# Patient Record
Sex: Female | Born: 1955 | Race: White | Hispanic: No | Marital: Married | Smoking: Never smoker
Health system: Southern US, Community
[De-identification: ages and names within clinical notes are randomized; demographics above are authoritative.]

## PROBLEM LIST (undated history)

## (undated) DIAGNOSIS — T7840XA Allergy, unspecified, initial encounter: Secondary | ICD-10-CM

## (undated) DIAGNOSIS — K529 Noninfective gastroenteritis and colitis, unspecified: Secondary | ICD-10-CM

## (undated) DIAGNOSIS — R32 Unspecified urinary incontinence: Secondary | ICD-10-CM

## (undated) DIAGNOSIS — M722 Plantar fascial fibromatosis: Secondary | ICD-10-CM

## (undated) DIAGNOSIS — G8929 Other chronic pain: Secondary | ICD-10-CM

## (undated) DIAGNOSIS — J45909 Unspecified asthma, uncomplicated: Secondary | ICD-10-CM

## (undated) DIAGNOSIS — M1990 Unspecified osteoarthritis, unspecified site: Secondary | ICD-10-CM

## (undated) HISTORY — DX: Unspecified osteoarthritis, unspecified site: M19.90

## (undated) HISTORY — PX: TONSILLECTOMY: SUR1361

## (undated) HISTORY — DX: Plantar fascial fibromatosis: M72.2

## (undated) HISTORY — DX: Allergy, unspecified, initial encounter: T78.40XA

## (undated) HISTORY — PX: DILATION AND CURETTAGE OF UTERUS: SHX78

## (undated) HISTORY — DX: Unspecified urinary incontinence: R32

## (undated) HISTORY — DX: Other chronic pain: G89.29

## (undated) HISTORY — DX: Noninfective gastroenteritis and colitis, unspecified: K52.9

---

## 2018-12-06 LAB — HM PAP SMEAR: HM Pap smear: ABNORMAL

## 2019-06-30 ENCOUNTER — Ambulatory Visit: Payer: Self-pay

## 2019-06-30 ENCOUNTER — Ambulatory Visit: Payer: Managed Care, Other (non HMO) | Attending: Internal Medicine

## 2019-06-30 DIAGNOSIS — Z20822 Contact with and (suspected) exposure to covid-19: Secondary | ICD-10-CM | POA: Insufficient documentation

## 2019-07-01 LAB — SARS-COV-2, NAA 2 DAY TAT

## 2019-07-01 LAB — NOVEL CORONAVIRUS, NAA: SARS-CoV-2, NAA: NOT DETECTED

## 2019-08-31 ENCOUNTER — Other Ambulatory Visit: Payer: Self-pay

## 2019-08-31 ENCOUNTER — Other Ambulatory Visit: Payer: Managed Care, Other (non HMO)

## 2019-08-31 DIAGNOSIS — Z20822 Contact with and (suspected) exposure to covid-19: Secondary | ICD-10-CM

## 2019-09-01 LAB — NOVEL CORONAVIRUS, NAA: SARS-CoV-2, NAA: NOT DETECTED

## 2019-09-01 LAB — SARS-COV-2, NAA 2 DAY TAT

## 2020-02-07 HISTORY — PX: COLONOSCOPY: SHX174

## 2020-04-27 ENCOUNTER — Other Ambulatory Visit: Payer: Self-pay

## 2020-04-27 ENCOUNTER — Ambulatory Visit
Admission: EM | Admit: 2020-04-27 | Discharge: 2020-04-27 | Disposition: A | Discharge status: Home / Self Care | Payer: Managed Care, Other (non HMO) | Attending: Internal Medicine | Admitting: Internal Medicine

## 2020-04-27 ENCOUNTER — Encounter: Payer: Self-pay | Admitting: Emergency Medicine

## 2020-04-27 DIAGNOSIS — J45901 Unspecified asthma with (acute) exacerbation: Secondary | ICD-10-CM | POA: Diagnosis not present

## 2020-04-27 DIAGNOSIS — T7840XA Allergy, unspecified, initial encounter: Secondary | ICD-10-CM | POA: Diagnosis not present

## 2020-04-27 HISTORY — DX: Unspecified asthma, uncomplicated: J45.909

## 2020-04-27 MED ORDER — ALBUTEROL SULFATE HFA 108 (90 BASE) MCG/ACT IN AERS
2.0000 | INHALATION_SPRAY | RESPIRATORY_TRACT | 0 refills | Status: DC | PRN
Start: 2020-04-27 — End: 2022-02-06

## 2020-04-27 NOTE — ED Provider Notes (Signed)
[facility]    CSN: [id number] Arrival date & time: 04/27/20  1115      History   Chief Complaint No chief complaint on file.   HPI Wendy Gallagher is a 65 y.o. female who  Developed SOB and wheezing after drinking tea and coffy 1 h ago. She does not know if there was some ingredient that she is allergic to, but has been using her inhaler and is better, but she is almost out and needs a refill. Denies fever, chills, sweats, throat swelling, tongue swelling.  She also took 1 dose of Loratadine.      Past Medical History:  Diagnosis Date  . Asthma     There are no problems to display for this patient.   History reviewed. No pertinent surgical history.  OB History   No obstetric history on file.      Home Medications    Prior to Admission medications   Medication Sig Start Date End Date Taking? Authorizing Provider  albuterol (VENTOLIN HFA) 108 (90 Base) MCG/ACT inhaler Inhale 2 puffs into the lungs every 4 (four) hours as needed for wheezing or shortness of breath. 04/27/20  Yes Rodriguez-Southworth, Nettie Elm, PA-C    Family History Family History  Problem Relation Age of Onset  . Atrial fibrillation Mother   . Cancer Father   . Heart attack Father     Social History Social History   Tobacco Use  . Smoking status: Never Smoker  . Smokeless tobacco: Never Used  Substance Use Topics  . Alcohol use: Yes    Alcohol/week: 1.0 standard drink    Types: 1 Glasses of wine per week  . Drug use: Never     Allergies   Patient has no known allergies.   Review of Systems Review of Systems  Gastrointestinal:       Gets intermittent diarrhea     Physical Exam Triage Vital Signs ED Triage Vitals  Enc Vitals Group     BP 04/27/20 1128 131/87     Pulse Rate 04/27/20 1128 89     Resp 04/27/20 1128 18     Temp 04/27/20 1128 97.6 F (36.4 C)     Temp Source 04/27/20 1128 Oral     SpO2 04/27/20 1128 95 %     Weight --      Height --      Head  Circumference --      Peak Flow --      Pain Score 04/27/20 1129 0     Pain Loc --      Pain Edu? --      Excl. in GC? --   Physical Exam Vitals signs and nursing note reviewed.  Constitutional:      General: She is not in acute distress.    Appearance: Normal appearance. She is not ill-appearing, toxic-appearing or diaphoretic.  HENT:     Head: Normocephalic.     Right Ear: Tympanic membrane, ear canal and external ear normal.     Left Ear: Tympanic membrane, ear canal and external ear normal.     Nose: Nose normal.     Mouth/Throat:     Mouth: Mucous membranes are moist.  Eyes:     General: No scleral icterus.       Right eye: No discharge.        Left eye: No discharge.     Conjunctiva/sclera: Conjunctivae normal.  Neck:     Musculoskeletal: Neck supple. No neck rigidity.  Cardiovascular:  Rate and Rhythm: Normal rate and regular rhythm.     Heart sounds: No murmur.  Pulmonary:     Effort: Pulmonary effort is normal.     Breath sounds: Normal breath sounds.  Abdominal:     General: Bowel sounds are normal. There is no distension.     Palpations: Abdomen is soft. There is no mass.     Tenderness: There is no abdominal tenderness. There is no guarding or rebound.     Hernia: No hernia is present.  Musculoskeletal: Normal range of motion.  Lymphadenopathy:     Cervical: No cervical adenopathy.  Skin:    General: Skin is warm and dry.     Coloration: Skin is not jaundiced.     Findings: No rash.  Neurological:     Mental Status: She is alert and oriented to person, place, and time.     Gait: Gait normal.  Psychiatric:        Mood and Affect: Mood normal.        Behavior: Behavior normal.        Thought Content: Thought content normal.        Judgment: Judgment normal.    No data found.  Updated Vital Signs BP 131/87 (BP Location: Right Arm)   Pulse 89   Temp 97.6 F (36.4 C) (Oral)   Resp 18   SpO2 95%  Pulse ox was repeated 98% after removing her  mask Visual Acuity Right Eye Distance:   Left Eye Distance:   Bilateral Distance:    Right Eye Near:   Left Eye Near:    Bilateral Near:     Physical Exam Physical Exam Vitals signs and nursing note reviewed.  Constitutional:      General: She is not in acute distress.    Appearance: Normal appearance. She is not ill-appearing, toxic-appearing or diaphoretic.  HENT:     Head: Normocephalic.     Right Ear: Tympanic membrane, ear canal and external ear normal.     Left Ear: Tympanic membrane, ear canal and external ear normal.     Nose: Nose normal.     Mouth/Throat:     Mouth: Mucous membranes are moist.  Eyes:     General: No scleral icterus.       Right eye: No discharge.        Left eye: No discharge.     Conjunctiva/sclera: Conjunctivae normal.  Neck:     Musculoskeletal: Neck supple. No neck rigidity.  Cardiovascular:     Rate and Rhythm: Normal rate and regular rhythm.     Heart sounds: No murmur.  Pulmonary:     Effort: Pulmonary effort is normal.     Breath sounds: Normal breath sounds.  Abdominal:     General: Bowel sounds are normal. There is no distension.     Palpations: Abdomen is soft. There is no mass.     Tenderness: There is no abdominal tenderness. There is no guarding or rebound.     Hernia: No hernia is present.  Musculoskeletal: Normal range of motion.  Lymphadenopathy:     Cervical: No cervical adenopathy.  Skin:    General: Skin is warm and dry.     Coloration: Skin is not jaundiced.     Findings: No rash.  Neurological:     Mental Status: She is alert and oriented to person, place, and time.     Gait: Gait normal.  Psychiatric:        Mood and  Affect: Mood normal.        Behavior: Behavior normal.        Thought Content: Thought content normal.        Judgment: Judgment normal.     UC Treatments / Results  Labs (all labs ordered are listed, but only abnormal results are displayed) Labs Reviewed - No data to  display  EKG   Radiology No results found.  Procedures Procedures (including critical care time)  Medications Ordered in UC Medications - No data to display  Initial Impression / Assessment and Plan / UC Course  I have reviewed the triage vital signs and the nursing notes. Asthma exacerbation and possibly allergic reaction. She is stable and I refilled her inhaler and advised to stay on Loratadine.    Final Clinical Impressions(s) / UC Diagnoses   Final diagnoses:  Asthma with acute exacerbation, unspecified asthma severity, unspecified whether persistent  Allergic reaction, initial encounter     Discharge Instructions     Stay on the Loratadine for a few days, one dose per day      ED Prescriptions    Medication Sig Dispense Auth. Provider   albuterol (VENTOLIN HFA) 108 (90 Base) MCG/ACT inhaler Inhale 2 puffs into the lungs every 4 (four) hours as needed for wheezing or shortness of breath. 18 g Rodriguez-Southworth, Nettie Elm, PA-C     PDMP not reviewed this encounter.   Garey Ham, PA-C 05/01/20 0710

## 2020-04-27 NOTE — ED Triage Notes (Signed)
States she put tea in her coffee this morning and states she started having trouble breathing after that this morning.

## 2020-04-27 NOTE — Discharge Instructions (Addendum)
Stay on the Loratadine for a few days, one dose per day

## 2020-06-18 ENCOUNTER — Ambulatory Visit: Payer: Managed Care, Other (non HMO) | Admitting: Family Medicine

## 2020-08-02 ENCOUNTER — Ambulatory Visit (INDEPENDENT_AMBULATORY_CARE_PROVIDER_SITE_OTHER): Payer: Managed Care, Other (non HMO) | Admitting: Medical

## 2020-08-02 ENCOUNTER — Other Ambulatory Visit: Payer: Self-pay

## 2020-08-02 ENCOUNTER — Encounter: Payer: Self-pay | Admitting: Medical

## 2020-08-02 VITALS — BP 124/80 | HR 100 | Temp 98.0°F | Wt 219.8 lb

## 2020-08-02 DIAGNOSIS — J4540 Moderate persistent asthma, uncomplicated: Secondary | ICD-10-CM

## 2020-08-02 DIAGNOSIS — L570 Actinic keratosis: Secondary | ICD-10-CM

## 2020-08-02 DIAGNOSIS — Z136 Encounter for screening for cardiovascular disorders: Secondary | ICD-10-CM | POA: Diagnosis not present

## 2020-08-02 DIAGNOSIS — R200 Anesthesia of skin: Secondary | ICD-10-CM

## 2020-08-02 DIAGNOSIS — M722 Plantar fascial fibromatosis: Secondary | ICD-10-CM

## 2020-08-02 DIAGNOSIS — H539 Unspecified visual disturbance: Secondary | ICD-10-CM

## 2020-08-02 DIAGNOSIS — K529 Noninfective gastroenteritis and colitis, unspecified: Secondary | ICD-10-CM

## 2020-08-02 DIAGNOSIS — R202 Paresthesia of skin: Secondary | ICD-10-CM

## 2020-08-02 MED ORDER — FLUTICASONE-SALMETEROL 100-50 MCG/ACT IN AEPB
1.0000 | INHALATION_SPRAY | Freq: Two times a day (BID) | RESPIRATORY_TRACT | 3 refills | Status: DC
Start: 2020-08-02 — End: 2022-02-06

## 2020-08-02 MED ORDER — HYDROQUINONE 4 % EX CREA: TOPICAL_CREAM | Freq: Two times a day (BID) | CUTANEOUS | 0 refills | Status: DC

## 2020-08-02 MED ORDER — ALBUTEROL SULFATE HFA 108 (90 BASE) MCG/ACT IN AERS
2.0000 | INHALATION_SPRAY | Freq: Four times a day (QID) | RESPIRATORY_TRACT | 1 refills | Status: DC | PRN
Start: 2020-08-02 — End: 2022-02-10

## 2020-08-02 NOTE — Progress Notes (Signed)
Subjective:  Wendy Gallagher is a 65 y.o. female who presents for Chief Complaint  Patient presents with   other    New pt. Est. Broke toe lt. Foot baby toe. Had an episode about 2 month ago felt like had a mini stroke. Pt. Has memory issues and ibs flares. Pt. Has had diarrhea off and on for about 1 year now has colonoscopy scheduled on Tuesday Dr. Jason Fila in [place].      Here as a new patient.  Hasn't had insurance for a while which has limited her access to care.  Medical team: [facility] - recent care for broke toe and plantar fascitis Not currently seeing eye doctor Needs to go back to dentist No other specialist   Concerns:  She has several concerns  She notes years ago had a sudden vision change where it was like a curtain going down, losing vision in upper half of visual field.  Had evaluation, and while in the ED symptoms resolved.  She notes 8 weeks had a change in vision similar but it was like curtain falling from right vision and lower field of vision seemed off.   Has had occasional numbness in limbs intermittent.  No palpations, no chest pain.     No recent leg swelling.  No palpations.  Has had occasional right hand numbness, but this is chronic that she attributes to neck issues.  No slurred speech, no hearing loss, no recent sudden change with numbness tingling or weakness.   Started taking OTC hawthorne, and has continued this since years ago which seems to help.  She notes being seen a few years ago for similar vision change and palpations, saw doctor then but was advised she had panic attack.  Asthma -  Has hx/o asthma, and breathing has been worse with the 90+ temps recently.  She has history of being on Advair in the remote past that did help.  She does not smoke  Does have hx/o urinary incontinence but no new changes.  She notes frequent chronic diarrhea since 2019.  Started after [place] burger [place] the airport and symptoms persist.  On average is 6x   daily, loose stools.   Seeing ortho for recent broken toe and plantar fasciitis  Concerned about age spots, right face  No other aggravating or relieving factors.    No other c/o.  Past Medical History:  Diagnosis Date   Allergy    Arthritis    Asthma    Chronic diarrhea    Chronic pain    Plantar fasciitis    Urinary incontinence    Current Outpatient Medications on File Prior to Visit  Medication Sig Dispense Refill   albuterol (VENTOLIN HFA) 108 (90 Base) MCG/ACT inhaler Inhale 2 puffs into the lungs every 4 (four) hours as needed for wheezing or shortness of breath. 18 g 0   No current facility-administered medications on file prior to visit.   Past Surgical History:  Procedure Laterality Date   COLONOSCOPY  02/2020   [facility]   TONSILLECTOMY       The following portions of the patient's history were reviewed and updated as appropriate: allergies, current medications, past family history, past medical history, past social history, past surgical history and problem list.  ROS Otherwise as in subjective above  Objective: BP 124/80   Pulse 100   Temp 98 F (36.7 C)   Wt 219 lb 12.8 oz (99.7 kg)   General appearance: alert, no distress, well developed,  well nourished, white female 3 views of the left somewhat trapezoidal lesions, each @1cm  x 0.34mm size of the right temple regio, 2 of them are yellow which one is more brown, all 3 uniform color suggestive for keratosis HEENT: normocephalic, sclerae anicteric, conjunctiva pink and moist Oral cavity: MMM, no lesions Neck: supple, no lymphadenopathy, no thyromegaly, no masses , no bruits Heart: RRR, normal S1, S2, no murmurs Lungs: CTA bilaterally, no wheezes, rhonchi, or rales Abdomen: +bs, soft, non tender, non distended, no masses, no hepatomegaly, no splenomegaly Pulses: 2+ radial pulses, 2+ pedal pulses, normal cap refill Ext: no edema, moderate LE varicose veins No visual field deficit.  EOMI,  PERRLA Neuro: Alert and oriented x3, nonfocal exam    EKG Indication numbness and screening heart disease Rate 88 bpm, normal sinus rhythm, PR 144 ms, QRS 110 ms, 474 ms QTc, axis 16 degrees, incomplete right bundle branch block, no prior EKG to compare.    Assessment: Encounter Diagnoses  Name Primary?   Vision changes Yes   Numbness and tingling    Moderate persistent asthma without complication    Solar keratosis    Chronic diarrhea    Plantar fasciitis    Screening for heart disease      Plan: Vision change, numbness  -I recommend you see an eye doctor for baseline evaluation.  Given the concern for prior vision loss years ago and given the recent unusual symptoms you have had, I will schedule you for a carotid ultrasound screening  Asthma-begin Advair preventative inhaler twice daily.  Rinse mouth out with water after use.  Continue emergency rescue inhaler albuterol as needed every 4-6 hours for wheezing, cough, shortness of breath.  You may find that you do not need the Advair during parts of the year.  So this is a preventative/maintenance inhaler during times of the year you have worse flareups  Chronic diarrhea-I recommend you follow back up with digestive health to get their recommendations if your colonoscopy was normal.  We will have you sign for a copy of the colonoscopy to be sent to [facility].  If there was no abnormal finding in if the diagnosis is IBS there are some treatments you could consider trying such as Viberzi daily medication or hyoscyamine or other antispasm medication.  I would avoid foods that make things worse particularly spicy foods acidic foods fried foods and junk food.  She has used Bentyl in the past.  Follow-up with gastroenterology as planned  Foot pain, plantar fasciitis-follow-up with orthopedics  Skin lesion, solar keratosis - begin hydroquinone cream to ageing spots.  Recheck 39mo    Gayathri was seen today for other.  Diagnoses and all orders  for this visit:  Vision changes -     3mo Carotid Duplex Bilateral; Future  Numbness and tingling -     US Carotid Duplex Bilateral; Future -     EKG 12-Lead  Moderate persistent asthma without complication  Solar keratosis  Chronic diarrhea  Plantar fasciitis  Screening for heart disease -     EKG 12-Lead  Other orders -     fluticasone-salmeterol (ADVAIR) 100-50 MCG/ACT AEPB; Inhale 1 puff into the lungs 2 (two) times daily. -     albuterol (VENTOLIN HFA) 108 (90 Base) MCG/ACT inhaler; Inhale 2 puffs into the lungs every 6 (six) hours as needed for wheezing or shortness of breath. -     hydroquinone 4 % cream; Apply topically 2 (two) times daily.   Follow up: pending studies

## 2020-08-07 LAB — HM COLONOSCOPY

## 2020-08-09 ENCOUNTER — Ambulatory Visit: Payer: Managed Care, Other (non HMO) | Admitting: Surgery

## 2020-08-10 ENCOUNTER — Ambulatory Visit
Admission: RE | Admit: 2020-08-10 | Discharge: 2020-08-10 | Disposition: A | Discharge status: Home / Self Care | Payer: Managed Care, Other (non HMO) | Source: Ambulatory Visit | Attending: Medical | Admitting: Medical

## 2020-08-10 DIAGNOSIS — R200 Anesthesia of skin: Secondary | ICD-10-CM

## 2020-08-10 DIAGNOSIS — R202 Paresthesia of skin: Secondary | ICD-10-CM

## 2020-08-10 DIAGNOSIS — H539 Unspecified visual disturbance: Secondary | ICD-10-CM

## 2020-08-15 ENCOUNTER — Ambulatory Visit: Payer: Managed Care, Other (non HMO) | Admitting: Surgery

## 2020-08-17 ENCOUNTER — Encounter: Payer: Self-pay | Admitting: Internal Medicine

## 2020-09-05 ENCOUNTER — Ambulatory Visit: Payer: Managed Care, Other (non HMO) | Admitting: Medical

## 2020-09-20 ENCOUNTER — Encounter: Payer: Self-pay | Admitting: Internal Medicine

## 2022-02-06 ENCOUNTER — Ambulatory Visit: Payer: 59 | Admitting: Medical

## 2022-02-06 VITALS — BP 120/70 | HR 89 | Wt 212.4 lb

## 2022-02-06 DIAGNOSIS — J4520 Mild intermittent asthma, uncomplicated: Secondary | ICD-10-CM | POA: Insufficient documentation

## 2022-02-06 DIAGNOSIS — Z7185 Encounter for immunization safety counseling: Secondary | ICD-10-CM | POA: Diagnosis not present

## 2022-02-06 DIAGNOSIS — M8580 Other specified disorders of bone density and structure, unspecified site: Secondary | ICD-10-CM | POA: Insufficient documentation

## 2022-02-06 DIAGNOSIS — Z8781 Personal history of (healed) traumatic fracture: Secondary | ICD-10-CM

## 2022-02-06 DIAGNOSIS — R053 Chronic cough: Secondary | ICD-10-CM | POA: Insufficient documentation

## 2022-02-06 MED ORDER — ALBUTEROL SULFATE HFA 108 (90 BASE) MCG/ACT IN AERS: 2.0000 | INHALATION_SPRAY | Freq: Four times a day (QID) | RESPIRATORY_TRACT | 1 refills | Status: DC | PRN

## 2022-02-06 NOTE — Patient Instructions (Addendum)
Mild asthma You can use albuterol inhaler 2 puffs every 4-6 hours as needed for shortness of breath, cough, chest tightness or wheezing I wrote a prescription for a spacer to use attached to the inhaler to get better effectiveness of the inhaler device Make sure you are taking 2 long deep inhalations when you use the medication Consider taking an allergy medicine such as Zyrtec or Allegra during periods where you have worse asthma flare such as spring or fall If you are having worse problems with asthma such as symptoms several days per week then consider a preventative inhaler which is not the same as albuterol.  Return to discuss this if you start to get more worse regular symptoms We will check a spirometry or PFT test periodically to check the status of your asthma.  We can do this today or at future visits if desired to get a baseline idea of your asthma If you do not see improvements in the next week or 2 with your cough we may need to consider a chest x-ray Given the fact that you had a hard time with coughing and illness this past year and never a few weeks ago, if you would like to go ahead and do a chest x-ray I can place an order for that  Vaccine recommendations There are several vaccines that are recommended given your age and given history of asthma.  I have included this information below Yearly influenza vaccine Pneumococcal pneumonia vaccine called Prevnar 20 x 1 Shingrix shingles vaccine 2 shots 2 months apart Tetanus vaccine Tdap that has the pertussis protection, 1 vaccine every 10 years RSV vaccine at your local pharmacy   Consider coming in soon for routine physical.  You have not had blood work since 2022.  A physical is a visit for we can discuss preventative measures, routine lab screening, heart disease screening, osteoporosis screening and other recommendations.

## 2022-02-06 NOTE — Progress Notes (Signed)
Subjective:  Wendy Gallagher is a 67 y.o. female who presents for Chief Complaint  Patient presents with   needs refill on inhaler    Needs refill on inhaler, 3 week ago- cough and still lingering       Here for asthma follow up.   Last visit here over a year ago.  Has been out of her inhaler but having some cough.  Been having chest tightness, cough, some productive cough for 3 weeks or so.  Felt exhausted the first week, but those symptoms improved, and now has annoying cough.  Has some runny nose, some congestion at times.   Some ear pressure yesterday, occasional sore throat.  Some rattling in chest but no wheezing.   Was around heavy smoke from father smoking as a child.  Has 3 dogs.  No ongoing monthly problems with asthma, but had bad illness in summer 2023 that aggravated her asthma.  Didn't tolerate Advair in the past.  Had covid infection 2019, and since then has had to use inhaler more in general than prior to 2019.   Nonsmoker.  No recent GERD issues aggravating cough.  Has colonoscopy in 2022.    Has arthritis.  At times lots of joint pains.  Using magnesium and boron supplement for arthritis and feels like this helps her arthritis.   She notes that she saw her dentist not so long ago and they recommended she be on osteoporosis treatment given some bone loss in her jaw.  They felt she needed to be on Fosamax or something similar.  No other aggravating or relieving factors.    No other c/o.  Past Medical History:  Diagnosis Date   Allergy    Arthritis    Asthma    Chronic diarrhea    Chronic pain    Plantar fasciitis    Urinary incontinence    Current Outpatient Medications on File Prior to Visit  Medication Sig Dispense Refill   albuterol (VENTOLIN HFA) 108 (90 Base) MCG/ACT inhaler Inhale 2 puffs into the lungs every 6 (six) hours as needed for wheezing or shortness of breath. 8 g 1   B Complex Vitamins (VITAMIN B COMPLEX PO) Take 1 tablet by mouth daily.     BORON  PO Take by mouth.     CALCIUM MAGNESIUM ZINC PO Take by mouth.     No current facility-administered medications on file prior to visit.    The following portions of the patient's history were reviewed and updated as appropriate: allergies, current medications, past family history, past medical history, past social history, past surgical history and problem list.  ROS Otherwise as in subjective above  Objective: BP 120/70   Pulse 89   Wt 212 lb 6.4 oz (96.3 kg)   General appearance: alert, no distress, well developed, well nourished HEENT: normocephalic, sclerae anicteric, conjunctiva pink and moist, TMs pearly, nares patent, no discharge or erythema, pharynx normal Oral cavity: MMM, no lesions Neck: supple, no lymphadenopathy, no thyromegaly, no masses Heart: RRR, normal S1, S2, no murmurs Lungs: CTA bilaterally, no wheezes, rhonchi, or rales Pulses: 2+ radial pulses, 2+ pedal pulses, normal cap refill Ext: no edema   Assessment: Encounter Diagnoses  Name Primary?   Mild intermittent asthma, unspecified whether complicated Yes   Chronic cough    Vaccine counseling    History of fracture    Bone loss      Plan: Exam unremarkable today. Discussed her asthma symptoms, triggers, treatment recommendations.  Discussed the following recommendations  which were printed for patient as well.  Advise she consider bone density test.  We discussed calcium and vitamin D supplements.  Patient Instructions  Mild asthma You can use albuterol inhaler 2 puffs every 4-6 hours as needed for shortness of breath, cough, chest tightness or wheezing I wrote a prescription for a spacer to use attached to the inhaler to get better effectiveness of the inhaler device Make sure you are taking 2 long deep inhalations when you use the medication Consider taking an allergy medicine such as Zyrtec or Allegra during periods where you have worse asthma flare such as spring or fall If you are having worse  problems with asthma such as symptoms several days per week then consider a preventative inhaler which is not the same as albuterol.  Return to discuss this if you start to get more worse regular symptoms We will check a spirometry or PFT test periodically to check the status of your asthma.  We can do this today or at future visits if desired to get a baseline idea of your asthma If you do not see improvements in the next week or 2 with your cough we may need to consider a chest x-ray Given the fact that you had a hard time with coughing and illness this past year and never a few weeks ago, if you would like to go ahead and do a chest x-ray I can place an order for that  Vaccine recommendations There are several vaccines that are recommended given your age and given history of asthma.  I have included this information below Yearly influenza vaccine Pneumococcal pneumonia vaccine called Prevnar 20 x 1 Shingrix shingles vaccine 2 shots 2 months apart Tetanus vaccine Tdap that has the pertussis protection, 1 vaccine every 10 years RSV vaccine at your local pharmacy   Consider coming in soon for routine physical.  You have not had blood work since 2022.  A physical is a visit for we can discuss preventative measures, routine lab screening, heart disease screening, osteoporosis screening and other recommendations.    Wendy Gallagher was seen today for needs refill on inhaler.  Diagnoses and all orders for this visit:  Mild intermittent asthma, unspecified whether complicated  Chronic cough  Vaccine counseling  History of fracture  Bone loss  Other orders -     albuterol (VENTOLIN HFA) 108 (90 Base) MCG/ACT inhaler; Inhale 2 puffs into the lungs every 6 (six) hours as needed for wheezing or shortness of breath.    Follow up: soon for physical

## 2022-02-10 ENCOUNTER — Telehealth: Payer: Self-pay | Admitting: Family Medicine

## 2022-02-10 ENCOUNTER — Ambulatory Visit: Payer: 59 | Admitting: Family Medicine

## 2022-02-10 ENCOUNTER — Encounter: Payer: Self-pay | Admitting: Family Medicine

## 2022-02-10 ENCOUNTER — Telehealth: Payer: Self-pay | Admitting: Medical

## 2022-02-10 VITALS — BP 108/60 | HR 80 | Temp 97.1°F | Ht 65.75 in | Wt 215.8 lb

## 2022-02-10 DIAGNOSIS — L02512 Cutaneous abscess of left hand: Secondary | ICD-10-CM

## 2022-02-10 MED ORDER — DOXYCYCLINE HYCLATE 100 MG PO TABS: 100.0000 mg | ORAL_TABLET | Freq: Two times a day (BID) | ORAL | 0 refills | Status: DC

## 2022-02-10 NOTE — Telephone Encounter (Signed)
Spoke with patient and she was more concerned about the light sensitivity so she decided to take the doxy and wear sunscreen.

## 2022-02-10 NOTE — Progress Notes (Signed)
Chief Complaint  Patient presents with   Wound Infection    Left thumb infection, she used a scalpel to open up her left thumb and she said a splinter came out and some white stuff. It is nagging and annoying. Saturday she felt sick, lethargic.    2-3 weeks ago she was helping her daughter move into her new house in [place].  She got a splinter from the banister, in her left thumb. She thought she removed the splinter. On the return trip, she could feel like there was still something in the thumb (discomfort with pressure, when turning knobs). On Friday evening (02/07/22) she unwrapped the bandage, it looked brighter red; yesterday it seemed worse, with some white area under the skin.  It was too tender to press on it. She lanced it yesterday afternoon.  It drained some pus, and another piece of splinter came out as well. It felt great an hour later. It is very sensitive to touch again today. She denies further drainage.  She doesn't feel well, possibly feverish.  She has taken some tylenol.   PMH, PSH, SH reviewed  Seen last week with asthma/cough.  Doing better now.  Outpatient Encounter Medications as of 02/10/2022  Medication Sig Note   acetaminophen (TYLENOL) 325 MG tablet Take 325 mg by mouth every 6 (six) hours as needed. 02/10/2022: Last dose one hour ago   Apoaequorin (PREVAGEN PO) Take 1 capsule by mouth daily.    Ascorbic Acid (VITAMIN C) 500 MG CAPS Take 1 capsule by mouth daily.    B Complex Vitamins (VITAMIN B COMPLEX PO) Take 1 tablet by mouth daily.    ECHINACEA EXTRACT PO Take 1 capsule by mouth daily.    HAWTHORN PO Take 1 capsule by mouth daily.    Magnesium 500 MG CAPS Take 1 capsule by mouth daily.    albuterol (VENTOLIN HFA) 108 (90 Base) MCG/ACT inhaler Inhale 2 puffs into the lungs every 6 (six) hours as needed for wheezing or shortness of breath. (Patient not taking: Reported on 02/10/2022) 02/10/2022: Used last week   [DISCONTINUED] albuterol (VENTOLIN HFA) 108 (90 Base)  MCG/ACT inhaler Inhale 2 puffs into the lungs every 6 (six) hours as needed for wheezing or shortness of breath.    [DISCONTINUED] BORON PO Take by mouth.    [DISCONTINUED] CALCIUM MAGNESIUM ZINC PO Take by mouth.    No facility-administered encounter medications on file as of 02/10/2022.   No Known Allergies   ROS: she feels "crummy", possibly feverish. No chills. No n/v/d. She had a recent cold/cough. Seen recently (last week) for her asthma.  Cough is improving. Hasn't been needing much albuterol. No n/v/d.  Has IBS, and is anxious about antibiotics causing diarrhea. She denies bleeding, bruising, or other concerns.   PHYSICAL EXAM:  BP 108/60   Pulse 80   Temp (!) 97.1 F (36.2 C) (Tympanic)   Ht 5' 5.75" (1.67 m)   Wt 215 lb 12.8 oz (97.9 kg)   BMI 35.10 kg/m   Wt Readings from Last 3 Encounters:  02/10/22 215 lb 12.8 oz (97.9 kg)  02/06/22 212 lb 6.4 oz (96.3 kg)  08/02/20 219 lb 12.8 oz (99.7 kg)   Talkative, pleasant female, in no distress HEENT: conjunctiva and sclera are clear, EOMI Neck: no lymphadenopathy or mass Heart: regular rate and rhythm Lungs: clear, no wheezing Extremities: upper--2+ pulses, brisk capillary refill in L thumb. L thumb-- 2 x 2 cm area of erythema and soft tissue swelling, with central  white area that measures 4.5 mm.  This central area is firm/rough.  The red area is soft, not indurated, not fluctuant.  +tender. This is located on the ulnar side of the palmar aspect of the thumb   ASSESSMENT/PLAN:  Abscess of left thumb - infection with foreign body/splinter.  some FB removed yesterday.  Continue soaks, treat cellulitis with ABX. f/u if worsening for I&D, not needed today - Plan: doxycycline (VIBRA-TABS) 100 MG tablet  Discussed Bactrim--recalls possible allergy, and is hesitant to use this. She would prefer MRSA coverage (also discussed rx for keflex), so will use doxycyline.  Counseled re: risks/SE, to use sunscreen. Discussed  probiotic use to help with bowels.   Take the antibiotic twice daily. Use sunscreen to protect from burns while on the medication. If you develop any diarrhea, take a probiotic to help re-set your gut flora. If you have a problem with the antibiotic, let us know and we can change it.  Please soak the thumb in either warm soapy water or epsom salts. 15 minutes 3-4 times per day. This should soften the firm central area, and possible allow further drainage, if there is any pus still inside.  There did not appear to be any significant abscess that required drainage.  Follow up next week for a recheck if not improving. Concerns would be fever, increasing size of the redness, streaking going up the arm, swelling and pain, crusting, vomiting, chills, etc.

## 2022-02-10 NOTE — Telephone Encounter (Signed)
Pt called and is requesting that her doxycycline be changed she said it was discussed it could mess her stomach up, and she dont think someone with IBS could handle it,  Pt uses  [facility] [phone] - [address] AT [address]

## 2022-02-10 NOTE — Patient Instructions (Signed)
  Take the antibiotic twice daily. Use sunscreen to protect from burns while on the medication. If you develop any diarrhea, take a probiotic to help re-set your gut flora. If you have a problem with the antibiotic, let us know and we can change it.  Please soak the thumb in either warm soapy water or epsom salts. 15 minutes 3-4 times per day. This should soften the firm central area, and possible allow further drainage, if there is any pus still inside.  There did not appear to be any significant abscess that required drainage.  Follow up next week for a recheck if not improving. Concerns would be fever, increasing size of the redness, streaking going up the arm, swelling and pain, crusting, vomiting, chills, etc.

## 2022-02-10 NOTE — Telephone Encounter (Signed)
We discussed that ANY antibiotic could bother your stomach, and to use a probiotic while taking it. She thought she might be allergic to sulfa drugs (but this was NOT listed as an allergy in her chart). Does she want me to switch to that (and verify her allergy?), or the other option we discussed was prescribing Keflex (which couldn't cover MRSA--which I think is fine, but she mentioned wanting MRSA coverage).

## 2022-02-17 NOTE — Telephone Encounter (Signed)
Error

## 2022-04-18 ENCOUNTER — Other Ambulatory Visit: Payer: Self-pay

## 2022-04-18 ENCOUNTER — Ambulatory Visit: Admission: EM | Admit: 2022-04-18 | Discharge: 2022-04-18 | Discharge status: Against Medical Advice | Payer: Self-pay

## 2022-04-18 ENCOUNTER — Encounter: Payer: Self-pay | Admitting: Emergency Medicine

## 2022-04-18 LAB — POCT URINALYSIS DIP (MANUAL ENTRY)
Bilirubin, UA: NEGATIVE
Blood, UA: NEGATIVE
Glucose, UA: NEGATIVE mg/dL
Nitrite, UA: NEGATIVE
Protein Ur, POC: NEGATIVE mg/dL
Spec Grav, UA: >=1.03 — AB (ref 1.010–1.025)
Urobilinogen, UA: 0.2 E.U./dL
pH, UA: 5 (ref 5.0–8.0)

## 2022-04-18 NOTE — ED Triage Notes (Signed)
Pt reports left groin tenderness and "burning" sensation since waking up this morning. Denies any known injury but reports is under a lot of stress lately. Pt able to bear weight.

## 2022-04-18 NOTE — ED Notes (Signed)
Pt left prior to provider assessment. Pt reported "was feeling better and needed fresh air" and walked out of UC. NP aware.

## 2022-04-18 NOTE — ED Notes (Signed)
Pt unable to provide urine sample at this time. Ice water provided. Pt aware urine sample needed.

## 2022-07-02 ENCOUNTER — Telehealth: Payer: Self-pay

## 2022-07-02 NOTE — Telephone Encounter (Signed)
Patient called the office at 3:30pm stating that earlier today she had an episode of sudden anxiousness, and a feeling that her heart was pounding in her chest. She says she felt her pulse and it was skipping beats every 6 seconds. This episode lasted 2 hours. She decided to take 2 tylenol, Vitamin C and 1/2 tablet of Aspirin 325mg . She had her husband pray for her and she started to feel better. This was about an hour ago. Patient is currently having heaviness in her chest and reflux like symptoms. I consulted with Dr. Susann Givens regarding patient's symptoms. Per Dr. Susann Givens, patient needs to go to the ER for evaluation. I advised patient of Dr. Jola Babinski recommendation. She was reluctant at first, due to not having anyone to watch her pets. Patient finally agreed to go to [facility] since this was the closest hospital near her current location.

## 2022-10-28 ENCOUNTER — Telehealth: Payer: 59 | Admitting: Medical

## 2022-10-29 ENCOUNTER — Other Ambulatory Visit: Payer: Self-pay

## 2022-10-29 ENCOUNTER — Telehealth: Payer: Self-pay | Admitting: Medical

## 2023-01-21 ENCOUNTER — Encounter: Payer: BC Managed Care – PPO | Admitting: Medical

## 2023-03-19 ENCOUNTER — Ambulatory Visit: Payer: Self-pay | Admitting: Medical

## 2023-04-24 ENCOUNTER — Telehealth: Payer: Self-pay | Admitting: Physician Assistant

## 2023-04-24 ENCOUNTER — Other Ambulatory Visit: Payer: Self-pay | Admitting: Medical

## 2023-04-24 ENCOUNTER — Ambulatory Visit: Payer: Self-pay

## 2023-04-24 DIAGNOSIS — J45901 Unspecified asthma with (acute) exacerbation: Secondary | ICD-10-CM

## 2023-04-24 MED ORDER — ALBUTEROL SULFATE HFA 108 (90 BASE) MCG/ACT IN AERS
2.0000 | INHALATION_SPRAY | Freq: Four times a day (QID) | RESPIRATORY_TRACT | 0 refills | Status: AC | PRN
Start: 2023-04-24 — End: ?

## 2023-04-24 NOTE — Telephone Encounter (Signed)
  Chief Complaint: watery eyes, sneezing,cough Symptoms: wheezing, mod SOB- can talk in full sentences Frequency: yes Pertinent Negatives: Patient denies fever Disposition: [] ED /[] Urgent Care (no appt availability in office) / [] Appointment(In office/virtual)/ [x]  [facility]/ [] Home Care/ [] Refused Recommended Disposition /[] [facility]/ []  Follow-up with PCP Additional Notes: assisted pt with MyChart Copied from CRM [id number]. Topic: Clinical - Red Word Triage >> Apr 24, 2023  2:39 PM Baldomero Bone wrote: Red Word that prompted transfer to Nurse Triage: albuterol  (VENTOLIN  HFA) 108 (90 Base) MCG/ACT inhaler [ Reason for Disposition  [1] MILD difficulty breathing (e.g., minimal/no SOB at rest, SOB with walking, pulse <100) AND [2] NEW-onset or WORSE than normal    Plus wheezing  Answer Assessment - Initial Assessment Questions 1. RESPIRATORY STATUS: "Describe your breathing?" (e.g., wheezing, shortness of breath, unable to speak, severe coughing)      SOB, wheezing 2. ONSET: "When did this breathing problem begin?"      Yesterday  3. PATTERN "Does the difficult breathing come and go, or has it been constant since it started?"      Worsens when close to dog 4. SEVERITY: "How bad is your breathing?" (e.g., mild, moderate, severe)    - MILD: No SOB at rest, mild SOB with walking, speaks normally in sentences, can lie down, no retractions, pulse < 100.    - MODERATE: SOB at rest, SOB with minimal exertion and prefers to sit, cannot lie down flat, speaks in phrases, mild retractions, audible wheezing, pulse 100-120.    - SEVERE: Very SOB at rest, speaks in single words, struggling to breathe, sitting hunched forward, retractions, pulse > 120      Moderate  5. RECURRENT SYMPTOM: "Have you had difficulty breathing before?" If Yes, ask: "When was the last time?" and "What happened that time?"      yes  7. LUNG HISTORY: "Do you have any history of lung disease?"  (e.g., pulmonary  embolus, asthma, emphysema)     asthma 8. CAUSE: "What do you think is causing the breathing problem?"      Allergies  9. OTHER SYMPTOMS: "Do you have any other symptoms? (e.g., dizziness, runny nose, cough, chest pain, fever)     Runny nose watery nose and tightness  Protocols used: Breathing Difficulty-A-AH

## 2023-04-24 NOTE — Patient Instructions (Signed)
 Shizue Pentecost, thank you for joining Angelia Kelp, PA-C for today's virtual visit.  While this provider is not your primary care provider (PCP), if your PCP is located in our provider database this encounter information will be shared with them immediately following your visit.   A [facility] MyChart account gives you access to today's visit and all your visits, tests, and labs performed at [facility] " click here if you don't have a [facility] MyChart account or go to [email or web address]  Consent: (Patient) Wendy Gallagher provided verbal consent for this virtual visit at the beginning of the encounter.  Current Medications:  Current Outpatient Medications:    albuterol  (VENTOLIN  HFA) 108 (90 Base) MCG/ACT inhaler, Inhale 2 puffs into the lungs every 6 (six) hours as needed for wheezing or shortness of breath., Disp: 18 g, Rfl: 0   B Complex Vitamins (VITAMIN B COMPLEX PO), Take 1 tablet by mouth daily., Disp: , Rfl:    ECHINACEA EXTRACT PO, Take 1 capsule by mouth daily., Disp: , Rfl:    HAWTHORN PO, Take 1 capsule by mouth daily., Disp: , Rfl:    Magnesium 500 MG CAPS, Take 1 capsule by mouth daily., Disp: , Rfl:    Medications ordered in this encounter:  Meds ordered this encounter  Medications   albuterol  (VENTOLIN  HFA) 108 (90 Base) MCG/ACT inhaler    Sig: Inhale 2 puffs into the lungs every 6 (six) hours as needed for wheezing or shortness of breath.    Dispense:  18 g    Refill:  0    Supervising Provider:   Corine Dice [[id number]]     *If you need refills on other medications prior to your next appointment, please contact your pharmacy*  Follow-Up: Call back or seek an in-person evaluation if the symptoms worsen or if the condition fails to improve as anticipated.  [facility] [phone]  Other Instructions Allergies, Adult An allergy is a condition that causes the body's defense system (immune system) to react too strongly to  an allergen. An allergen is a substance that is harmless to most people but can cause a reaction in some people. Allergies often affect the nose (allergic rhinitis), eyes (conjunctivitis), skin (atopic dermatitis), and stomach. They can be mild, moderate, or severe. They cannot spread from person to person. Allergies can start at any age. In some cases, they may go away as you get older. What are the causes? Allergies are caused by allergens. These may be: Outdoor allergens. These include pollen, car fumes, and mold. Indoor allergens. These include dust, smoke, mold, and pet dander. Other allergens. These include foods, medicines, scents, and insect bites or stings. What increases the risk? You are more likely to have allergies if you have: Family members with allergies. Family members who have a condition that may be caused by allergens, such as asthma. What are the signs or symptoms? Symptoms depend on how severe your allergy is. Mild to moderate symptoms Runny nose, stuffy nose (nasal congestion), or sneezing. Itchy mouth, ears, or throat. Postnasal drip. This is a feeling of mucus dripping down the back of your throat. Sore throat. Itchy, red, watery, or puffy eyes. Skin rash, or itchy, red, swollen areas of skin (hives). Stomach cramps or bloating. Severe symptoms A bad allergy to food, medicine, or insect bites may cause a severe reaction (anaphylactic reaction). Symptoms include: A red face. Coughing or making high-pitched whistling sounds when you breathe, most often when you breathe out (  wheezing). Swollen lips, tongue, or mouth. A tight or swollen throat. Chest pain or tightness, or a fast heartbeat. Trouble breathing or shortness of breath. Pain in your abdomen. Vomiting or diarrhea. Feeling dizzy or fainting. How is this diagnosed? Allergies are diagnosed based on your symptoms, your family and medical history, and a physical exam. You may also have tests, such as: Skin  tests. These may be done to see how your skin reacts to allergens. Tests include: Skin prick test. For this test, the allergen is put in your body through a small prick in the skin. Intradermal skin test. For this test, a small amount of the allergen is put under the first layer of your skin. Patch test. For this test, a small amount of the allergen is placed on your skin. The area is covered and then checked after a few days. Blood tests. A challenge test. For this test, you eat or breathe in the allergen to see if you have a reaction. You may also be asked to: Keep a food diary. This means writing down all the foods, drinks, and symptoms you have in a day. Try an elimination diet. To do this: Stop eating certain foods. Add those foods back one by one to find out if any of them cause a reaction. How is this treated?     Treatment for allergies depends on your symptoms. It may include: Cold, wet cloths (cold compresses). These can be used to soothe itching and swelling. Eye drops or nasal sprays. A saline solution to clear out your nose and keep it moist (nasal irrigation). A saline solution is made of salt and water. A humidifier. This can add moisture to the air. Skin creams. These can treat rashes or itching. Diet changes to cut out foods that cause allergies. Being exposed again and again to tiny amounts of allergens. This can help your body build a defense against them (tolerance). This process is called immunotherapy. It may be done using: Allergy shots. This is when you get a shot of the allergen. Sublingual immunotherapy. This is when you take a small dose of the allergen under your tongue. Allergy medicines (antihistamines) or other medicines. These can help block the allergic reaction. Using an auto-injector pen. An auto-injector pen is a device filled with medicine that gives an emergency shot of epinephrine. Your health care provider will teach you how to use it. Follow these  instructions at home: Medicines  Take or apply over-the-counter and prescription medicines only as told by your provider. Always carry your auto-injector pen if you are at risk of an anaphylactic reaction. Give yourself the shot as told by your provider. Eating and drinking Follow instructions from your provider about what you may eat and drink. Drink enough fluid to keep your pee (urine) pale yellow. General instructions Wear a medical alert bracelet or necklace if you have had an anaphylactic reaction in the past. Avoid known allergens when you can. Keep all follow-up visits. Your provider will watch your symptoms and talk about treatment options with you. Contact a health care provider if: Your symptoms do not get better with treatment. Get help right away if: You have any symptoms of anaphylactic reaction. You use an auto-injector pen. You will need more medical care even if the medicine seems to be working. An anaphylactic reaction may happen again within 72 hours (rebound anaphylaxis). These symptoms may be an emergency. Use the auto-injector pen right away. Then call 911. Do not wait to see if  the symptoms will go away. Do not drive yourself to the hospital. This information is not intended to replace advice given to you by your health care provider. Make sure you discuss any questions you have with your health care provider. Document Revised: 09/04/2021 Document Reviewed: 09/04/2021 Elsevier Patient Education  2024 Elsevier Inc.   If you have been instructed to have an in-person evaluation today at a local Urgent Care facility, please use the link below. It will take you to a list of all of our available [facility], including address, phone number and hours of operation. Please do not delay care.  [facility]  If you or a family member do not have a primary care provider, use the link below to schedule a visit and establish care. When you choose a [facility] primary care physician or advanced practice provider, you gain a long-term partner in health. Find a Primary Care Provider  Learn more about [facility]'s in-office and virtual care options: [facility] - Get Care Now

## 2023-04-24 NOTE — Telephone Encounter (Signed)
 Copied from CRM [id number]. Topic: Clinical - Medication Refill >> Apr 24, 2023  2:37 PM Bascom RAMAN wrote: Most Recent Primary Care Visit:  Provider: RANDOL, EVE  Department: [facility]  Visit Type: ACUTE 30  Date: 02/10/2022  Medication: albuterol  (VENTOLIN  HFA) 108 (90 Base) MCG/ACT inhaler [  Has the patient contacted their pharmacy? No (Agent: If no, request that the patient contact the pharmacy for the refill. If patient does not wish to contact the pharmacy document the reason why and proceed with request.) (Agent: If yes, when and what did the pharmacy advise?)  Is this the correct pharmacy for this prescription? Yes If no, delete pharmacy and type the correct one.  This is the patient's preferred pharmacy:  [facility] [phone] - [place], [place] - [address] AT [facility] [address]  [place] [address] Phone: [phone] Fax: [phone]   Has the prescription been filled recently? No  Is the patient out of the medication? Yes  Has the patient been seen for an appointment in the last year OR does the patient have an upcoming appointment? Yes  Can we respond through MyChart? Yes  Agent: Please be advised that Rx refills may take up to 3 business days. We ask that you follow-up with your pharmacy.

## 2023-04-24 NOTE — Progress Notes (Signed)
 Virtual Visit Consent   Wendy Gallagher, you are scheduled for a virtual visit with a [facility] provider today. Just as with appointments in the office, your consent must be obtained to participate. Your consent will be active for this visit and any virtual visit you may have with one of our providers in the next 365 days. If you have a MyChart account, a copy of this consent can be sent to you electronically.  As this is a virtual visit, video technology does not allow for your provider to perform a traditional examination. This may limit your provider's ability to fully assess your condition. If your provider identifies any concerns that need to be evaluated in person or the need to arrange testing (such as labs, EKG, etc.), we will make arrangements to do so. Although advances in technology are sophisticated, we cannot ensure that it will always work on either your end or our end. If the connection with a video visit is poor, the visit may have to be switched to a telephone visit. With either a video or telephone visit, we are not always able to ensure that we have a secure connection.  By engaging in this virtual visit, you consent to the provision of healthcare and authorize for your insurance to be billed (if applicable) for the services provided during this visit. Depending on your insurance coverage, you may receive a charge related to this service.  I need to obtain your verbal consent now. Are you willing to proceed with your visit today? Wendy Gallagher has provided verbal consent on 04/24/2023 for a virtual visit (video or telephone). Angelia Kelp, PA-C  Date: 04/24/2023 4:37 PM   Virtual Visit via Video Note   I, Angelia Kelp, connected with  Wendy Gallagher  ([id number], 1955/11/23) on 04/24/23 at  4:30 PM EDT by a video-enabled telemedicine application and verified that I am speaking with the correct person using two identifiers.  Location: Patient: Virtual Visit Location Patient:  Home Provider: Virtual Visit Location Provider: Home Office   I discussed the limitations of evaluation and management by telemedicine and the availability of in person appointments. The patient expressed understanding and agreed to proceed.    History of Present Illness: Wendy Gallagher is a 68 y.o. who identifies as a female who was assigned female at birth, and is being seen today for wheezing, runny nose, sneezing.  HPI: URI  This is a new problem. The current episode started yesterday. The problem has been gradually worsening. There has been no fever. Associated symptoms include congestion, coughing, headaches, rhinorrhea, sneezing and wheezing. Pertinent negatives include no chest pain, ear pain, nausea, plugged ear sensation, sinus pain, sore throat or swollen glands. She has tried antihistamine for the symptoms. The treatment provided no relief.  PMH: Asthma    Problems:  Patient Active Problem List   Diagnosis Date Noted   Mild intermittent asthma 02/06/2022   Chronic cough 02/06/2022   Vaccine counseling 02/06/2022   History of fracture 02/06/2022   Bone loss 02/06/2022   Vision changes 08/02/2020   Numbness and tingling 08/02/2020   Moderate persistent asthma without complication 08/02/2020   Solar keratosis 08/02/2020   Chronic diarrhea 08/02/2020   Plantar fasciitis 08/02/2020   Screening for heart disease 08/02/2020    Allergies: No Known Allergies Medications:  Current Outpatient Medications:    albuterol  (VENTOLIN  HFA) 108 (90 Base) MCG/ACT inhaler, Inhale 2 puffs into the lungs every 6 (six) hours as needed for wheezing or shortness of  breath., Disp: 18 g, Rfl: 0   B Complex Vitamins (VITAMIN B COMPLEX PO), Take 1 tablet by mouth daily., Disp: , Rfl:    ECHINACEA EXTRACT PO, Take 1 capsule by mouth daily., Disp: , Rfl:    HAWTHORN PO, Take 1 capsule by mouth daily., Disp: , Rfl:    Magnesium 500 MG CAPS, Take 1 capsule by mouth daily., Disp: , Rfl:    Observations/Objective: Patient is well-developed, well-nourished in no acute distress.  Resting comfortably at home.  Head is normocephalic, atraumatic.  No labored breathing.  Speech is clear and coherent with logical content.  Patient is alert and oriented at baseline.    Assessment and Plan: 1. Mild asthma exacerbation (Primary) - albuterol  (VENTOLIN  HFA) 108 (90 Base) MCG/ACT inhaler; Inhale 2 puffs into the lungs every 6 (six) hours as needed for wheezing or shortness of breath.  Dispense: 18 g; Refill: 0  - Mild asthma exacerbation from seasonal allergies - Started Zyrtec today - Albuterol  refilled - Discussed steam, humidifier, and hot teas - Warm compresses to face for sinus congestion - Saline nasal rinses - Seek further evaluation if symptoms change or worsen  Follow Up Instructions: I discussed the assessment and treatment plan with the patient. The patient was provided an opportunity to ask questions and all were answered. The patient agreed with the plan and demonstrated an understanding of the instructions.  A copy of instructions were sent to the patient via MyChart unless otherwise noted below.    The patient was advised to call back or seek an in-person evaluation if the symptoms worsen or if the condition fails to improve as anticipated.    Angelia Kelp, PA-C

## 2023-04-27 NOTE — Telephone Encounter (Signed)
 Pt was seen already

## 2023-04-27 NOTE — Telephone Encounter (Signed)
 This was reordered by another provider that she saw for her Asthma.

## 2023-04-29 ENCOUNTER — Ambulatory Visit: Payer: Self-pay

## 2023-04-29 ENCOUNTER — Ambulatory Visit: Payer: PRIVATE HEALTH INSURANCE | Admitting: Family Medicine

## 2023-04-29 ENCOUNTER — Encounter: Payer: Self-pay | Admitting: Family Medicine

## 2023-04-29 VITALS — BP 118/68 | HR 84 | Temp 98.4°F | Ht 65.75 in | Wt 209.4 lb

## 2023-04-29 DIAGNOSIS — J069 Acute upper respiratory infection, unspecified: Secondary | ICD-10-CM

## 2023-04-29 DIAGNOSIS — J45901 Unspecified asthma with (acute) exacerbation: Secondary | ICD-10-CM | POA: Diagnosis not present

## 2023-04-29 DIAGNOSIS — J309 Allergic rhinitis, unspecified: Secondary | ICD-10-CM | POA: Diagnosis not present

## 2023-04-29 MED ORDER — PREDNISONE 10 MG (21) PO TBPK
ORAL_TABLET | ORAL | 0 refills | Status: DC
Start: 2023-04-29 — End: 2023-06-09

## 2023-04-29 NOTE — Patient Instructions (Addendum)
 Stay well hydrated.  Continue taking an antihistamine daily (stick with either zyrtec or alavert, and take it once daily).  Take Mucinex DM 12 hour tablets, twice daily. This has the expectorant (to thin out any mucus or phlegm) and a cough suppressant. You may use benadryl at bedtime, if needed to help with allergies, and/or sleep.   You may use melatonin for sleep, if not taking benadryl (don't take too many sedating things together).  Sleep with the head of the bed elevated (or even consider sleeping in the recliner tonight).  Continue to use the albuterol  as needed for wheezing.  If your wheezing and congestion doesn't improve, and your mucus remains clear, then fill the steroid prescription from the pharmacy. If you develop fever, and your mucus and phlegm is getting darker yellow and remains discolored, then an antibiotic is warranted.

## 2023-04-29 NOTE — Telephone Encounter (Signed)
 Copied from CRM [id number]. Topic: Clinical - Red Word Triage >> Apr 29, 2023  2:42 PM Everlene Hobby D wrote: Having consistence wheezing while resting and sleeping it keeps her awake. She getting over the flu and wants to know if there's something she can take for wheezing. She is short of breath it's been about 6 days she uses her inhaler.   Chief Complaint: Wheezing, Breathing Difficulty  Symptoms: Wheezing  Frequency: One week  Pertinent Negatives: Patient denies fever, chest pain, dizziness  Disposition: [x] ED /[] Urgent Care (no appt availability in office) / [] Appointment(In office/virtual)/ []  [facility]/ [] Home Care/ [x] Refused Recommended Disposition /[] [facility]/ []  Follow-up with PCP Additional Notes: DJ is being triaged for dyspnea at rest and exertion. The patient also reports wheezing. The patient reports sleeping elevated at night to provide some relief. The patient's wheezing is loud according to her and she has been using her PRN inhaler more than usual. Recommended the ED for prompt evaluation and treatment, the patient refused due to her having a " Lack of patience." Opting to call the UC instead for a nebulizer treatment.   Reason for Disposition  Wheezing can be heard across the room  Answer Assessment - Initial Assessment Questions 1. RESPIRATORY STATUS: "Describe your breathing?" (e.g., wheezing, shortness of breath, unable to speak, severe coughing)       Dyspnea at Rest and exertion  2. ONSET: "When did this breathing problem begin?"      All week  3. PATTERN "Does the difficult breathing come and go, or has it been constant since it started?"      Constant  4. SEVERITY: "How bad is your breathing?" (e.g., mild, moderate, severe)    - MILD: No SOB at rest, mild SOB with walking, speaks normally in sentences, can lie down, no retractions, pulse < 100.    - MODERATE: SOB at rest, SOB with minimal exertion and prefers to sit, cannot lie down  flat, speaks in phrases, mild retractions, audible wheezing, pulse 100-120.    - SEVERE: Very SOB at rest, speaks in single words, struggling to breathe, sitting hunched forward, retractions, pulse > 120      Moderate-Severe  5. RECURRENT SYMPTOM: "Have you had difficulty breathing before?" If Yes, ask: "When was the last time?" and "What happened that time?"      No  6. CARDIAC HISTORY: "Do you have any history of heart disease?" (e.g., heart attack, angina, bypass surgery, angioplasty)      No  7. LUNG HISTORY: "Do you have any history of lung disease?"  (e.g., pulmonary embolus, asthma, emphysema)     Asthma  8. CAUSE: "What do you think is causing the breathing problem?"      Unsure  9. OTHER SYMPTOMS: "Do you have any other symptoms? (e.g., dizziness, runny nose, cough, chest pain, fever)     Cough,   10. O2 SATURATION MONITOR:  "Do you use an oxygen saturation monitor (pulse oximeter) at home?" If Yes, ask: "What is your reading (oxygen level) today?" "What is your usual oxygen saturation reading?" (e.g., 95%)       Unsure, unable to locate  11. PREGNANCY: "Is there any chance you are pregnant?" "When was your last menstrual period?"       No and No  12. TRAVEL: "Have you traveled out of the country in the last month?" (e.g., travel history, exposures)       Recently got over the flu  Protocols used: Breathing Difficulty-A-AH

## 2023-04-29 NOTE — Progress Notes (Signed)
 Chief Complaint  Patient presents with   Cough    Cough that started Last Thursday. Wheezing that is keeping her awake at night. Was resting today and said her breathing felt heavy. Did virtual visit 04/24/23 only given albuterol . No testing was done.     4/18 virtual visit for coughing and wheezing, which she states started 4/17. She believes she caught a flu-like illness from a family member. She had general malaise, fatigue. No fever, chills.  She was prescribed albuterol  inhaler for mild asthma exacerbation related to allergies.  She has been blowing her nose more the last 2 days, getting up some light yellow mucus. In the office today the mucus was all clear. She also reports chronic PND. She presents today with complaints of ongoing wheezing, while resting and sleeping, keeping her awake. She coughed nonstop yesterday. Cough was productive of yellow chunks. She had been taking some Nyquil severe for cough, none since yesterday.  She feels much better today, feels like "things lifted".  Not coughing up as much phlegm today. When she lays down, she feels very wheezy. Can't sleep due to wheezing--the noise of the wheezing is what keeps her up, more than a problem with breathing or feeling short of breath. She woke up from a nap feeling very short of breath. That is when she called, and was very wheezy. Feels much better after some chamomille tea with honey. She used albuterol  1.5 hours ago, didn't find it particularly helpful.    PMH, PSH, SH reviewed Asthma  Outpatient Encounter Medications as of 04/29/2023  Medication Sig Note   albuterol  (VENTOLIN  HFA) 108 (90 Base) MCG/ACT inhaler Inhale 2 puffs into the lungs every 6 (six) hours as needed for wheezing or shortness of breath. 04/29/2023: Last used about an hour ago   Loratadine (ALAVERT PO) Take 1 tablet by mouth daily. 04/29/2023: Last dose yesterday   Multiple Vitamin (MULTIVITAMIN) tablet Take 1 tablet by mouth daily.    B  Complex Vitamins (VITAMIN B COMPLEX PO) Take 1 tablet by mouth daily. (Patient not taking: Reported on 04/29/2023)    cetirizine (ZYRTEC) 10 MG tablet Take 10 mg by mouth daily. (Patient not taking: Reported on 04/29/2023) 04/29/2023: Last dose Monday   ECHINACEA EXTRACT PO Take 1 capsule by mouth daily. (Patient not taking: Reported on 04/29/2023)    HAWTHORN PO Take 1 capsule by mouth daily. (Patient not taking: Reported on 04/29/2023)    Magnesium 500 MG CAPS Take 1 capsule by mouth daily. (Patient not taking: Reported on 04/29/2023)    No facility-administered encounter medications on file as of 04/29/2023.   She reports she has taken zyrtec (2 nights ago), took alavert last night.  No Known Allergies  ROS: some sweats today only.  No known fever, chills. No nausea or vomiting.  Often as diarrhea (IBS), nothing new. No dysuria. +leakage with coughing. No chest pain (slight on right side on the way to office, resolved). Denies headaches, dizziness. Chronic PND, some nasal congestion x2d. Denies sinus pain.     PHYSICAL EXAM:  BP 118/68   Pulse 84   Temp 98.4 F (36.9 C) (Tympanic)   Ht 5' 5.75" (1.67 m)   Wt 209 lb 6.4 oz (95 kg)   SpO2 97%   BMI 34.06 kg/m   Speaking comfortably, in no distress Rare coughing during visit, and some nose blowing (clear mucus). HEENT: conjunctiva and sclera are clear. TM's and EAC's normal Nasal mucosa is mild-mod edematous, pale Sinuses nontender OP clear  Neck: no lymphadenopathy or mass Heart: regular rate and rhythm Lungs:  some ronchi, which cleared with coughing. She had some mild end-expiratory wheezing noted anteriorly only. Fair air movement throughout. No rales. Psych: mildly anxious. Normal hygiene and grooming Neuro: alert and oriented, cranial nerves grossly intact. Normal gait.     ASSESSMENT/PLAN:  Mild asthma exacerbation - some upper airway component due to chest congestion PND. Cont albuterol . Rx for prednisone  if  wheezing/breathing not improving - Plan: predniSONE  (STERAPRED UNI-PAK 21 TAB) 10 MG (21) TBPK tablet  Allergic rhinitis, unspecified seasonality, unspecified trigger - encouraged daily antihistamine (stick with one kind), and Mucinex DM. Sleep with elevated HOB. Reviewed s/sx infection, none present now  Viral upper respiratory illness - per patient, and reports significantly better. Suspect that current symptoms are related to allergies/PND   I spent 42 minutes dedicated to the care of this patient, including pre-visit review of records, face to face time, post-visit ordering of testing and documentation.  Continue taking an antihistamine daily (stick with either zyrtec or alavert, and take it once daily).  Take Mucinex DM 12 hour tablets, twice daily. This has the expectorant (to thin out any mucus or phlegm) and a cough suppressant. You may use benadryl at bedtime, if needed to help with allergies, and/or sleep.   You may use melatonin for sleep, if not taking benadryl (don't take too many sedating things together).  Sleep with the head of the bed elevated (or even consider sleeping in the recliner tonight).  Continue to use the albuterol  as needed for wheezing.  If your wheezing and congestion doesn't improve, and your mucus remains clear, then fill the steroid prescription from the pharmacy. If you develop fever, and your mucus and phlegm is getting darker yellow and remains discolored, then an antibiotic is warranted.

## 2023-04-29 NOTE — Telephone Encounter (Signed)
 Pt coming in today

## 2023-06-09 ENCOUNTER — Telehealth: Payer: Self-pay

## 2023-06-09 ENCOUNTER — Ambulatory Visit (INDEPENDENT_AMBULATORY_CARE_PROVIDER_SITE_OTHER): Payer: PRIVATE HEALTH INSURANCE | Admitting: Medical

## 2023-06-09 ENCOUNTER — Encounter: Payer: Self-pay | Admitting: Medical

## 2023-06-09 VITALS — BP 112/72 | HR 82 | Ht 65.75 in | Wt 215.0 lb

## 2023-06-09 DIAGNOSIS — R41840 Attention and concentration deficit: Secondary | ICD-10-CM | POA: Diagnosis not present

## 2023-06-09 DIAGNOSIS — F439 Reaction to severe stress, unspecified: Secondary | ICD-10-CM

## 2023-06-09 DIAGNOSIS — Z630 Problems in relationship with spouse or partner: Secondary | ICD-10-CM

## 2023-06-09 DIAGNOSIS — Z6834 Body mass index (BMI) 34.0-34.9, adult: Secondary | ICD-10-CM

## 2023-06-09 DIAGNOSIS — J4520 Mild intermittent asthma, uncomplicated: Secondary | ICD-10-CM

## 2023-06-09 DIAGNOSIS — Z9189 Other specified personal risk factors, not elsewhere classified: Secondary | ICD-10-CM

## 2023-06-09 MED ORDER — METHYLPHENIDATE HCL 10 MG PO TABS: 5.0000 mg | ORAL_TABLET | Freq: Two times a day (BID) | ORAL | 0 refills | Status: DC

## 2023-06-09 NOTE — Addendum Note (Signed)
 Addended by: Charliene Conte A on: 06/09/2023 01:48 PM   Modules accepted: Orders

## 2023-06-09 NOTE — Telephone Encounter (Signed)
 Copied from CRM [id number]. Topic: Clinical - Request for Lab/Test Order >> Jun 09, 2023  2:36 PM Yolanda T wrote: Reason for CRM: Kia from [facility] stated they can not bill Z codes so it would need to be changed. Also depending on the patient insurance, they will not cover her visits without a diabetes diagnosis in which it will cost her over $400. Please f/u with Kia at [phone]

## 2023-06-09 NOTE — Progress Notes (Addendum)
 Subjective:  Wendy Gallagher is a 68 y.o. female who presents for Chief Complaint  Patient presents with   Acute Visit    Having issues with focus. Was on Ritalin a long time maybe 10 years.      Here for concerns about focus.    Lately feels off with focus, mood, tasks.    Thinks covid played some role with focus and mood.  Has had covid infection several times, similar to flu symptoms.  Generally would feel sick in bed for a week, then week of coughing, then a 3rd week of fatigue.  Last illness with covid 2 months ago, feels still fatigued from this somewhat.   Has a stressful marriage, having marital conflict.   Is currently behind on things in general, behind getting tasks done, chores around the house.  Feels overwhelmed that she can't get stuff done.    In the past, has seen psychiatry.  Has been on medicaiton prior for ADD and mood.  Prior antidepressant felt suicidal. Ritalin in the past seemed to help with focus and attention.  Not currently seeing counseling.  Just changed insurance as well.    Denies depressed mood.  Is trying to get back out and be more active including exercise, going to park.  Feels this will help with her mood as well.    Has some panic attacks in the past  Wants nutritionist referral to work on healthier diet as well   No other aggravating or relieving factors.    No other c/o.  Past Medical History:  Diagnosis Date   Allergy    Arthritis    Asthma    Chronic diarrhea    Chronic pain    Plantar fasciitis    Urinary incontinence    Current Outpatient Medications on File Prior to Visit  Medication Sig Dispense Refill   albuterol  (VENTOLIN  HFA) 108 (90 Base) MCG/ACT inhaler Inhale 2 puffs into the lungs every 6 (six) hours as needed for wheezing or shortness of breath. 18 g 0   ECHINACEA EXTRACT PO Take 1 capsule by mouth daily.     HAWTHORN PO Take 1 capsule by mouth daily.     Magnesium 500 MG CAPS Take 1 capsule by mouth daily.      Multiple Vitamin (MULTIVITAMIN) tablet Take 1 tablet by mouth daily.     B Complex Vitamins (VITAMIN B COMPLEX PO) Take 1 tablet by mouth daily. (Patient not taking: Reported on 06/09/2023)     cetirizine (ZYRTEC) 10 MG tablet Take 10 mg by mouth daily. (Patient not taking: Reported on 06/09/2023)     Loratadine (ALAVERT PO) Take 1 tablet by mouth daily. (Patient not taking: Reported on 06/09/2023)     No current facility-administered medications on file prior to visit.     The following portions of the patient's history were reviewed and updated as appropriate: allergies, current medications, past family history, past medical history, past social history, past surgical history and problem list.  ROS Otherwise as in subjective above    Objective: BP 112/72   Pulse 82   Ht 5' 5.75" (1.67 m)   Wt 215 lb (97.5 kg)   SpO2 98%   BMI 34.97 kg/m   General appearance: alert, no distress, well developed, well nourished Psych: pleasant, good eye contact, though's are a little convoluted today but answers questions otherwise appropriately, somewhat easily dis tractable No pressure speech, smiling    Assessment: Encounter Diagnoses  Name Primary?   Attention deficit Yes  Marital conflict    Stress at home    Has poorly balanced diet    BMI 34.0-34.9,adult    Mild intermittent asthma without complication    Morbid obesity (HCC)      Plan: Discussed symptoms, concerns .   I reviewed her questionnaires that were all abnormal positive including GAD-7, PHQ-9 and ADHD questionnaire  I advise she make appointment with counseling.  I gave her a list of counselors to call.  We discussed ways to deal with stress and focus.  We discussed exercise, healthy diet recommendations, limiting caffeine and processed foods, working on good sleep.  We discussed setting goals.  Begin back on trial of methylphenidate Ritalin.  Discussed risk and benefits and proper use of medication and safeguarding the  medication.  Referral to nutrition due to morbid obesity, BMI>34  Return soon for a well visit and fasting labs  Wendy Gallagher was seen today for acute visit.  Diagnoses and all orders for this visit:  Attention deficit  Marital conflict  Stress at home  Has poorly balanced diet -     Amb ref to Medical Nutrition Therapy-MNT  BMI 34.0-34.9,adult  Mild intermittent asthma without complication  Morbid obesity (HCC)  Other orders -     methylphenidate (RITALIN) 10 MG tablet; Take 0.5-1 tablets (5-10 mg total) by mouth 2 (two) times daily.    Follow up: 36mo

## 2023-06-09 NOTE — Patient Instructions (Signed)
Counseling Services  [facility] [phone] [address] [place], [place] [address]   [facility], counseling [address] [place], [place] [address] [phone]   [facility] [phone] [address],  [place], [place] [address]   Dr. Len Blalock Child and teen psychiatrist [address], [place], [place] [address] [phone]   Dr. Nicole Cella, [place] [phone] [place], [place] [address]   [facility] [address] [place], [place] [address] [phone]    [facility] Crisis Line and Main phone number [phone]  [facility]  [phone]  [facility] [phone]  Adult Crisis Center [phone]   [facility] [address] [place], [place], [place] [address] [phone]  [facility],  [address],  [place], [place] [address] [phone]    [facility] [phone] [address] [place], [place], [place] [address]   [facility] [phone] office [address] [place], [place] [address] Crisis services, Family support, in home therapy, treatment for Anxiety, PTSD, Sexual Assault, Substance Abuse, Financial/Credit Counseling, Variety of other services     Other Resources  If you are experiencing a mental health crisis or an emergency, please call 911 or go to the nearest emergency department.  [facility]   [phone] [facility]  [phone] [facility]   [phone]  Suicide Hotline 1-800-Suicide (630)410-8066)  National Suicide Prevention Lifeline 623-658-6650  (508)507-8219)  Domestic Violence, Rape/Crisis - [facility] [place] 892-119-4174  [employer] Violence Hotline 1-800-799-SAFE 618-116-3114)  To report Child or Elder Abuse, please call: [facility] Police Department  [phone] [facility] Department  [phone]  Teen Crisis line [phone] or [phone]

## 2023-06-09 NOTE — Progress Notes (Signed)
 Placed referral to help with better diet

## 2023-06-10 NOTE — Telephone Encounter (Signed)
 This message was mistakenly routed to [facility] medicine.  Please have your medical assistant contact nutrition and diabetes to see if the additional codes suffice.

## 2023-06-10 NOTE — Telephone Encounter (Signed)
 Added more dx codes to referral

## 2023-07-08 ENCOUNTER — Ambulatory Visit: Payer: PRIVATE HEALTH INSURANCE | Admitting: Medical

## 2023-07-14 ENCOUNTER — Encounter: Payer: Self-pay | Admitting: Medical

## 2023-07-14 ENCOUNTER — Ambulatory Visit: Payer: PRIVATE HEALTH INSURANCE | Admitting: Medical

## 2023-07-14 VITALS — BP 114/76 | HR 78 | Ht 65.0 in | Wt 211.2 lb

## 2023-07-14 DIAGNOSIS — Z1389 Encounter for screening for other disorder: Secondary | ICD-10-CM

## 2023-07-14 DIAGNOSIS — Z13220 Encounter for screening for lipoid disorders: Secondary | ICD-10-CM

## 2023-07-14 DIAGNOSIS — Z7185 Encounter for immunization safety counseling: Secondary | ICD-10-CM | POA: Diagnosis not present

## 2023-07-14 DIAGNOSIS — Z0000 Encounter for general adult medical examination without abnormal findings: Secondary | ICD-10-CM

## 2023-07-14 DIAGNOSIS — L989 Disorder of the skin and subcutaneous tissue, unspecified: Secondary | ICD-10-CM

## 2023-07-14 DIAGNOSIS — H269 Unspecified cataract: Secondary | ICD-10-CM

## 2023-07-14 DIAGNOSIS — Z136 Encounter for screening for cardiovascular disorders: Secondary | ICD-10-CM

## 2023-07-14 DIAGNOSIS — I8390 Asymptomatic varicose veins of unspecified lower extremity: Secondary | ICD-10-CM

## 2023-07-14 DIAGNOSIS — J4520 Mild intermittent asthma, uncomplicated: Secondary | ICD-10-CM

## 2023-07-14 LAB — LIPID PANEL

## 2023-07-14 NOTE — Progress Notes (Signed)
 Subjective:   HPI  Wendy Gallagher is a 68 y.o. female who presents for Chief Complaint  Patient presents with   Annual Exam    Fasting cpe, would discuss focusing, declines mammogram and possible bone density. Thinks she is up to date with vaccines    Patient Care Team: Jordan Pardini, Alm RAMAN, PA-C as PCP - General (Family Medicine)   Concerns: She reports history of arthritis, intermittent joint pain particular worse when raining outside   She notes a history of IBS, and intermittent diarrhea  She has varicose veins.  Uses compression hose sometimes.  She wants referral to ophthalmology for history of cataract  She has pending scheduled visit with nutritionist soon.  She is not currently exercising.  Has a history of asthma.  No recent problems with her asthma.  Reviewed their medical, surgical, family, social, medication, and allergy history and updated chart as appropriate.  No Known Allergies  Past Medical History:  Diagnosis Date   Allergy    Arthritis    Asthma    Chronic diarrhea    IBS   Chronic pain    Plantar fasciitis    Urinary incontinence     Current Outpatient Medications on File Prior to Visit  Medication Sig Dispense Refill   albuterol  (VENTOLIN  HFA) 108 (90 Base) MCG/ACT inhaler Inhale 2 puffs into the lungs every 6 (six) hours as needed for wheezing or shortness of breath. 18 g 0   B Complex Vitamins (VITAMIN B COMPLEX PO) Take 1 tablet by mouth daily.     Calcium Carbonate-Vit D-Min (CALCIUM 1200 PO) Take by mouth.     HAWTHORN PO Take 1 capsule by mouth daily.     Magnesium 500 MG CAPS Take 1 capsule by mouth daily.     methylphenidate  (RITALIN ) 10 MG tablet Take 0.5-1 tablets (5-10 mg total) by mouth 2 (two) times daily. 60 tablet 0   Multiple Vitamin (MULTIVITAMIN) tablet Take 1 tablet by mouth daily.     cetirizine (ZYRTEC) 10 MG tablet Take 10 mg by mouth daily. (Patient not taking: Reported on 07/14/2023)     ECHINACEA EXTRACT PO Take 1 capsule by  mouth daily. (Patient not taking: Reported on 07/14/2023)     Loratadine (ALAVERT PO) Take 1 tablet by mouth daily. (Patient not taking: Reported on 07/14/2023)     No current facility-administered medications on file prior to visit.      Current Outpatient Medications:    albuterol  (VENTOLIN  HFA) 108 (90 Base) MCG/ACT inhaler, Inhale 2 puffs into the lungs every 6 (six) hours as needed for wheezing or shortness of breath., Disp: 18 g, Rfl: 0   B Complex Vitamins (VITAMIN B COMPLEX PO), Take 1 tablet by mouth daily., Disp: , Rfl:    Calcium Carbonate-Vit D-Min (CALCIUM 1200 PO), Take by mouth., Disp: , Rfl:    HAWTHORN PO, Take 1 capsule by mouth daily., Disp: , Rfl:    Magnesium 500 MG CAPS, Take 1 capsule by mouth daily., Disp: , Rfl:    methylphenidate  (RITALIN ) 10 MG tablet, Take 0.5-1 tablets (5-10 mg total) by mouth 2 (two) times daily., Disp: 60 tablet, Rfl: 0   Multiple Vitamin (MULTIVITAMIN) tablet, Take 1 tablet by mouth daily., Disp: , Rfl:    cetirizine (ZYRTEC) 10 MG tablet, Take 10 mg by mouth daily. (Patient not taking: Reported on 07/14/2023), Disp: , Rfl:    ECHINACEA EXTRACT PO, Take 1 capsule by mouth daily. (Patient not taking: Reported on 07/14/2023), Disp: , Rfl:  Loratadine (ALAVERT PO), Take 1 tablet by mouth daily. (Patient not taking: Reported on 07/14/2023), Disp: , Rfl:   Family History  Problem Relation Age of Onset   Hearing loss Mother    Arthritis Mother    Atrial fibrillation Mother    Hearing loss Father    Cancer Father        bladder   Heart attack Father    Cancer Sister        breast   Heart failure Paternal Grandmother     Past Surgical History:  Procedure Laterality Date   COLONOSCOPY  02/2020   [facility]   DILATION AND CURETTAGE OF UTERUS     prior miscarriage   TONSILLECTOMY        Review of Systems  Constitutional:  Negative for chills, fever, malaise/fatigue and weight loss.  HENT:  Negative for congestion, ear pain, hearing loss,  sore throat and tinnitus.   Eyes:  Negative for blurred vision, pain and redness.  Respiratory:  Negative for cough, hemoptysis and shortness of breath.   Cardiovascular:  Negative for chest pain, palpitations, orthopnea, claudication and leg swelling.  Gastrointestinal:  Positive for diarrhea. Negative for abdominal pain, blood in stool, constipation, nausea and vomiting.  Genitourinary:  Negative for dysuria, flank pain, frequency, hematuria and urgency.  Musculoskeletal:  Positive for joint pain. Negative for falls and myalgias.  Skin:  Negative for itching and rash.       Aging spots  Neurological:  Negative for dizziness, tingling, speech change, weakness and headaches.  Endo/Heme/Allergies:  Negative for polydipsia. Does not bruise/bleed easily.  Psychiatric/Behavioral:  Negative for depression and memory loss. The patient is not nervous/anxious and does not have insomnia.         Objective:  BP 114/76   Pulse 78   Ht 5' 5 (1.651 m)   Wt 211 lb 3.2 oz (95.8 kg)   SpO2 98%   BMI 35.15 kg/m   General appearance: alert, no distress, WD/WN, Caucasian female Skin: Right temple with round 1.5 cm diameter flat lesion suggestive of solar keratosis, other scattered freckles and macules.  No specific worrisome lesions HEENT: normocephalic, conjunctiva/corneas normal, sclerae anicteric, PERRLA, EOMi, nares patent, no discharge or erythema, pharynx normal Oral cavity: MMM, tongue normal, teeth in good repair Neck: supple, no lymphadenopathy, no thyromegaly, no masses, normal ROM, no bruits Chest: non tender, normal shape and expansion Heart: RRR, normal S1, S2, no murmurs Lungs: CTA bilaterally, no wheezes, rhonchi, or rales Abdomen: +bs, soft, non tender, non distended, no masses, no hepatomegaly, no splenomegaly, no bruits Back: non tender, normal ROM, no scoliosis Musculoskeletal: upper extremities non tender, no obvious deformity, normal ROM throughout, lower extremities non tender,  no obvious deformity, normal ROM throughout Extremities: +moderate left leg and mild right leg varicose veins, no edema, no cyanosis, no clubbing Pulses: 2+ symmetric, upper and lower extremities, normal cap refill Neurological: alert, oriented x 3, CN2-12 intact, strength normal upper extremities and lower extremities, sensation normal throughout, DTRs 2+ throughout, no cerebellar signs, gait normal Psychiatric: normal affect, behavior normal, pleasant  Breast/gyn/rectal - deferred to gynecology     Assessment and Plan :   Encounter Diagnoses  Name Primary?   Encounter for health maintenance examination in adult Yes   Vaccine counseling    Screening for hematuria or proteinuria    Encounter for lipid screening for cardiovascular disease    Mild intermittent asthma, unspecified whether complicated    Skin lesion    Varicose veins of  lower extremity, unspecified laterality, unspecified whether complicated    Screening for heart disease    Cataract, unspecified cataract type, unspecified laterality      This visit was a preventative care visit, also known as wellness visit or routine physical.   Topics typically include healthy lifestyle, diet, exercise, preventative care, vaccinations, sick and well care, proper use of emergency dept and after hours care, as well as other concerns.    Separate significant issues discussed: Asthma-continue albuterol  rescue inhaler as needed.  If you are having fairly frequent issues with breathing or wheezing, then we would recommend a daily preventative inhaler  Cataract-referral today to ophthalmology  Skin lesions-labs today.  Consider dermatology referral   General Recommendations: Continue to return yearly for your annual wellness and preventative care visits.  This gives us  a chance to discuss healthy lifestyle, exercise, vaccinations, review your chart record, and perform screenings where appropriate.  I recommend you see your eye doctor  yearly for routine vision care.  I recommend you see your dentist yearly for routine dental care including hygiene visits twice yearly.   Vaccination recommendations were reviewed Immunization History  Administered Date(s) Administered   Influenza,inj,Quad PF,6+ Mos 12/14/2017   MMR 12/14/2017   Moderna Covid-19 Fall Seasonal Vaccine 37yrs & older 01/14/2023   Zoster Recombinant(Shingrix) 02/08/2020, 06/23/2022    Vaccine recommendations: Tdap, prevnar 20, yearly flu  Vaccines administered today: Declines today, thinks she had at a pharmacy in recent years.   Screening for cancer: Colon cancer screening: Prior or last colon cancer screen: 2022, due repeat 2027.   Breast cancer screening: You should perform a self breast exam monthly.   We reviewed recommendations for regular mammograms and breast cancer screening. Declines mammogram.  Cervical cancer screening: We reviewed recommendations for pap smear screening. Last pap - she notes normal few years ago.  Last one in [place].  Declines pap, and per USPSTF screening not routinely recommended beyond age 69 for lower risk.   Skin cancer screening: Check your skin regularly for new changes, growing lesions, or other lesions of concern Come in for evaluation if you have skin lesions of concern.  Lung cancer screening: If you have a greater than 20 pack year history of tobacco use, then you may qualify for lung cancer screening with a chest CT scan.   Please call your insurance company to inquire about coverage for this test.  Pancreatic cancer: no current screening test is available routinely recommended.  (Risk factors: Smoking, overweight or obese, diabetes, chronic pancreatitis, work Nurse, mental health, Solicitor, 2 year old or greater, female greater than female, African-American, family history of pancreatic cancer, hereditary breast, ovarian, melanoma, Lynch, Peutz-jeghers).  We currently don't have  screenings for other cancers besides breast, cervical, colon, and lung cancers.  If you have a strong family history of cancer or have other cancer screening concerns, please let me know.    Bone health: Get at least 150 minutes of aerobic exercise weekly Get weight bearing exercise at least once weekly Bone density test:  A bone density test is an imaging test that uses a type of X-ray to measure the amount of calcium and other minerals in your bones. The test may be used to diagnose or screen you for a condition that causes weak or thin bones (osteoporosis), predict your risk for a broken bone (fracture), or determine how well your osteoporosis treatment is working. The bone density test is recommended for females 65 and older, or females or males <65 if certain risk factors  such as thyroid disease, long term use of steroids such as for asthma or rheumatological issues, vitamin D  deficiency, estrogen deficiency, family history of osteoporosis, self or family history of fragility fracture in first degree relative.    Heart health: Get at least 150 minutes of aerobic exercise weekly Limit alcohol It is important to maintain a healthy blood pressure and healthy cholesterol numbers  Heart disease screening: Screening for heart disease includes screening for blood pressure, fasting lipids, glucose/diabetes screening, BMI height to weight ratio, reviewed of smoking status, physical activity, and diet.    Goals include blood pressure 120/80 or less, maintaining a healthy lipid/cholesterol profile, preventing diabetes or keeping diabetes numbers under good control, not smoking or using tobacco products, exercising most days per week or at least 150 minutes per week of exercise, and eating healthy variety of fruits and vegetables, healthy oils, and avoiding unhealthy food choices like fried food, fast food, high sugar and high cholesterol foods.    Other tests may possibly include EKG test, CT  coronary calcium score, echocardiogram, exercise treadmill stress test.    Heart disease testing completed: 2022 EKG reviewed.   Consider CT coronary test.  She will consider and let me know.     Vascular disease screening: For high risk individuals including smokers, diabetes, patients with known heart disease or high blood pressure, kidney disease, and others, screening for vascular disease or atherosclerosis of the arteries is available.  Examples may include carotid ultrasound, abdominal aortic ultrasound, ABI blood flow screening in the legs, thoracic aorta screening.  Consider referral to vascular specialist given the varicose veins.  I recommend you wear over the counter compression hose daily   Medical care options: I recommend you continue to seek care here first for routine care.  We try really hard to have available appointments Monday through Friday daytime hours for sick visits, acute visits, and physicals.  Urgent care should be used for after hours and weekends for significant issues that cannot wait till the next day.  The emergency department should be used for significant potentially life-threatening emergencies.  The emergency department is expensive, can often have long wait times for less significant concerns, so try to utilize primary care, urgent care, or telemedicine when possible to avoid unnecessary trips to the emergency department.  Virtual visits and telemedicine have been introduced since the pandemic started in 2020, and can be convenient ways to receive medical care.  We offer virtual appointments as well to assist you in a variety of options to seek medical care.   Legal Take the time to do a Last Will and Testament, advanced directives including Healthcare Power of Attorney and Living Will documents.  Do not leave your family with burdens that can be handled ahead of time.   Advanced Directives: I recommend you consider completing a Health Care Power of  Attorney and Living Will.   These documents respect your wishes and help alleviate burdens on your loved ones if you were to become terminally ill or be in a position to need those documents enforced.    You can complete Advanced Directives yourself, have them notarized, then have copies made for our office, for you and for anybody you feel should have them in safe keeping.  Or, you can have an attorney prepare these documents.   If you haven't updated your Last Will and Testament in a while, it may be worthwhile having an attorney prepare these documents together and save on some costs.  Wendy Gallagher was seen today for annual exam.  Diagnoses and all orders for this visit:  Encounter for health maintenance examination in adult -     Ambulatory referral to Ophthalmology -     CBC with Differential/Platelet -     Comprehensive metabolic panel with GFR -     TSH -     Lipid panel -     VITAMIN D  25 Hydroxy (Vit-D Deficiency, Fractures) -     Hemoglobin A1c -     Hepatitis C antibody -     Urinalysis, Routine w reflex microscopic  Vaccine counseling  Screening for hematuria or proteinuria -     Urinalysis, Routine w reflex microscopic  Encounter for lipid screening for cardiovascular disease -     Lipid panel  Mild intermittent asthma, unspecified whether complicated  Skin lesion -     TSH -     VITAMIN D  25 Hydroxy (Vit-D Deficiency, Fractures)  Varicose veins of lower extremity, unspecified laterality, unspecified whether complicated  Screening for heart disease  Cataract, unspecified cataract type, unspecified laterality -     Ambulatory referral to Ophthalmology     Follow-up pending labs, yearly for physical

## 2023-07-14 NOTE — Patient Instructions (Signed)
 This visit was a preventative care visit, also known as wellness visit or routine physical.   Topics typically include healthy lifestyle, diet, exercise, preventative care, vaccinations, sick and well care, proper use of emergency dept and after hours care, as well as other concerns.    Separate significant issues discussed: Asthma-continue albuterol  rescue inhaler as needed.  If you are having fairly frequent issues with breathing or wheezing, then we would recommend a daily preventative inhaler  Cataract-referral today to ophthalmology  Skin lesions-labs today.  Consider dermatology referral   General Recommendations: Continue to return yearly for your annual wellness and preventative care visits.  This gives us  a chance to discuss healthy lifestyle, exercise, vaccinations, review your chart record, and perform screenings where appropriate.  I recommend you see your eye doctor yearly for routine vision care.  I recommend you see your dentist yearly for routine dental care including hygiene visits twice yearly.   Vaccination recommendations were reviewed Immunization History  Administered Date(s) Administered   Influenza,inj,Quad PF,6+ Mos 12/14/2017   MMR 12/14/2017   Moderna Covid-19 Fall Seasonal Vaccine 67yrs & older 01/14/2023   Zoster Recombinant(Shingrix) 02/08/2020, 06/23/2022    Vaccine recommendations: Tdap, prevnar 20, yearly flu  Vaccines administered today: Declines today, thinks she had at a pharmacy in recent years.   Screening for cancer: Colon cancer screening: Prior or last colon cancer screen: 2022, due repeat 2027.   Breast cancer screening: You should perform a self breast exam monthly.   We reviewed recommendations for regular mammograms and breast cancer screening. Declines mammogram.  Cervical cancer screening: We reviewed recommendations for pap smear screening. Last pap - she notes normal few years ago.  Last one in [place].   Declines pap, and per USPSTF screening not routinely recommended beyond age 36 for lower risk.   Skin cancer screening: Check your skin regularly for new changes, growing lesions, or other lesions of concern Come in for evaluation if you have skin lesions of concern.  Lung cancer screening: If you have a greater than 20 pack year history of tobacco use, then you may qualify for lung cancer screening with a chest CT scan.   Please call your insurance company to inquire about coverage for this test.  Pancreatic cancer: no current screening test is available routinely recommended.  (Risk factors: Smoking, overweight or obese, diabetes, chronic pancreatitis, work Nurse, mental health, Solicitor, 26 year old or greater, female greater than female, African-American, family history of pancreatic cancer, hereditary breast, ovarian, melanoma, Lynch, Peutz-jeghers).  We currently don't have screenings for other cancers besides breast, cervical, colon, and lung cancers.  If you have a strong family history of cancer or have other cancer screening concerns, please let me know.    Bone health: Get at least 150 minutes of aerobic exercise weekly Get weight bearing exercise at least once weekly Bone density test:  A bone density test is an imaging test that uses a type of X-ray to measure the amount of calcium and other minerals in your bones. The test may be used to diagnose or screen you for a condition that causes weak or thin bones (osteoporosis), predict your risk for a broken bone (fracture), or determine how well your osteoporosis treatment is working. The bone density test is recommended for females 65 and older, or females or males <65 if certain risk factors such as thyroid disease, long term use of steroids such as for asthma or rheumatological issues, vitamin D  deficiency, estrogen deficiency, family history of osteoporosis, self or family  history of fragility fracture in first degree  relative.    Heart health: Get at least 150 minutes of aerobic exercise weekly Limit alcohol It is important to maintain a healthy blood pressure and healthy cholesterol numbers  Heart disease screening: Screening for heart disease includes screening for blood pressure, fasting lipids, glucose/diabetes screening, BMI height to weight ratio, reviewed of smoking status, physical activity, and diet.    Goals include blood pressure 120/80 or less, maintaining a healthy lipid/cholesterol profile, preventing diabetes or keeping diabetes numbers under good control, not smoking or using tobacco products, exercising most days per week or at least 150 minutes per week of exercise, and eating healthy variety of fruits and vegetables, healthy oils, and avoiding unhealthy food choices like fried food, fast food, high sugar and high cholesterol foods.    Other tests may possibly include EKG test, CT coronary calcium score, echocardiogram, exercise treadmill stress test.    Heart disease testing completed: 2022 EKG reviewed.   Consider CT coronary test.  She will consider and let me know.     Vascular disease screening: For high risk individuals including smokers, diabetes, patients with known heart disease or high blood pressure, kidney disease, and others, screening for vascular disease or atherosclerosis of the arteries is available.  Examples may include carotid ultrasound, abdominal aortic ultrasound, ABI blood flow screening in the legs, thoracic aorta screening.  Consider referral to vascular specialist given the varicose veins.  I recommend you wear over the counter compression hose daily   Medical care options: I recommend you continue to seek care here first for routine care.  We try really hard to have available appointments Monday through Friday daytime hours for sick visits, acute visits, and physicals.  Urgent care should be used for after hours and weekends for significant issues that  cannot wait till the next day.  The emergency department should be used for significant potentially life-threatening emergencies.  The emergency department is expensive, can often have long wait times for less significant concerns, so try to utilize primary care, urgent care, or telemedicine when possible to avoid unnecessary trips to the emergency department.  Virtual visits and telemedicine have been introduced since the pandemic started in 2020, and can be convenient ways to receive medical care.  We offer virtual appointments as well to assist you in a variety of options to seek medical care.   Legal Take the time to do a Last Will and Testament, advanced directives including Healthcare Power of Attorney and Living Will documents.  Do not leave your family with burdens that can be handled ahead of time.   Advanced Directives: I recommend you consider completing a Health Care Power of Attorney and Living Will.   These documents respect your wishes and help alleviate burdens on your loved ones if you were to become terminally ill or be in a position to need those documents enforced.    You can complete Advanced Directives yourself, have them notarized, then have copies made for our office, for you and for anybody you feel should have them in safe keeping.  Or, you can have an attorney prepare these documents.   If you haven't updated your Last Will and Testament in a while, it may be worthwhile having an attorney prepare these documents together and save on some costs

## 2023-07-15 ENCOUNTER — Other Ambulatory Visit: Payer: Self-pay | Admitting: Medical

## 2023-07-15 ENCOUNTER — Ambulatory Visit: Payer: Self-pay | Admitting: Medical

## 2023-07-15 LAB — CBC WITH DIFFERENTIAL/PLATELET
Basophils Absolute: 0 x10E3/uL (ref 0.0–0.2)
Basos: 0 %
EOS (ABSOLUTE): 0.1 x10E3/uL (ref 0.0–0.4)
Eos: 1 %
Hematocrit: 47.9 % — ABNORMAL HIGH (ref 34.0–46.6)
Hemoglobin: 15 g/dL (ref 11.1–15.9)
Immature Grans (Abs): 0 x10E3/uL (ref 0.0–0.1)
Immature Granulocytes: 0 %
Lymphocytes Absolute: 1.7 x10E3/uL (ref 0.7–3.1)
Lymphs: 26 %
MCH: 29.5 pg (ref 26.6–33.0)
MCHC: 31.3 g/dL — ABNORMAL LOW (ref 31.5–35.7)
MCV: 94 fL (ref 79–97)
Monocytes Absolute: 0.5 x10E3/uL (ref 0.1–0.9)
Monocytes: 7 %
Neutrophils Absolute: 4.5 x10E3/uL (ref 1.4–7.0)
Neutrophils: 66 %
Platelets: 206 x10E3/uL (ref 150–450)
RBC: 5.08 x10E6/uL (ref 3.77–5.28)
RDW: 13.2 % (ref 11.7–15.4)
WBC: 6.8 x10E3/uL (ref 3.4–10.8)

## 2023-07-15 LAB — URINALYSIS, ROUTINE W REFLEX MICROSCOPIC
Specific Gravity, UA: 1.023 (ref 1.005–1.030)
Urobilinogen, Ur: 0.2 mg/dL (ref 0.2–1.0)
pH, UA: 5.5 (ref 5.0–7.5)

## 2023-07-15 LAB — LIPID PANEL
Cholesterol, Total: 215 mg/dL — AB (ref 100–199)
HDL: 61 mg/dL (ref >39)
LDL CALC COMMENT:: 3.5 ratio (ref 0.0–4.4)
LDL Chol Calc (NIH): 124 mg/dL — AB (ref 0–99)
Triglycerides: 173 mg/dL — AB (ref 0–149)
VLDL Cholesterol Cal: 30 mg/dL (ref 5–40)

## 2023-07-15 LAB — COMPREHENSIVE METABOLIC PANEL WITH GFR
ALT: 21 IU/L (ref 0–32)
AST: 21 IU/L (ref 0–40)
Albumin: 4.4 g/dL (ref 3.9–4.9)
Alkaline Phosphatase: 73 IU/L (ref 44–121)
BUN/Creatinine Ratio: 20 (ref 12–28)
BUN: 13 mg/dL (ref 8–27)
Bilirubin Total: 0.3 mg/dL (ref 0.0–1.2)
CO2: 20 mmol/L (ref 20–29)
Calcium: 9.3 mg/dL (ref 8.7–10.3)
Chloride: 102 mmol/L (ref 96–106)
Creatinine, Ser: 0.65 mg/dL (ref 0.57–1.00)
Globulin, Total: 2.4 g/dL (ref 1.5–4.5)
Glucose: 104 mg/dL — AB (ref 70–99)
Potassium: 4.5 mmol/L (ref 3.5–5.2)
Sodium: 139 mmol/L (ref 134–144)
Total Protein: 6.8 g/dL (ref 6.0–8.5)
eGFR: 96 mL/min/1.73 (ref >59)

## 2023-07-15 LAB — MICROSCOPIC EXAMINATION
Casts: NONE SEEN /LPF
Epithelial Cells (non renal): >10 /HPF — AB (ref 0–10)
WBC, UA: NONE SEEN /HPF (ref 0–5)

## 2023-07-15 LAB — HEMOGLOBIN A1C
Est. average glucose Bld gHb Est-mCnc: 114 mg/dL
Hgb A1c MFr Bld: 5.6 (ref 4.8–5.6)

## 2023-07-15 LAB — HEPATITIS C ANTIBODY: Hep C Virus Ab: NONREACTIVE

## 2023-07-15 LAB — TSH: TSH: 3.56 u[IU]/mL (ref 0.450–4.500)

## 2023-07-15 LAB — VITAMIN D 25 HYDROXY (VIT D DEFICIENCY, FRACTURES): Vit D, 25-Hydroxy: 23.4 ng/mL — AB (ref 30.0–100.0)

## 2023-07-15 MED ORDER — METHYLPHENIDATE HCL 10 MG PO TABS: 5.0000 mg | ORAL_TABLET | Freq: Two times a day (BID) | ORAL | 0 refills | Status: AC

## 2023-07-15 MED ORDER — VITAMIN D (ERGOCALCIFEROL) 1.25 MG (50000 UNIT) PO CAPS: 50000.0000 [IU] | ORAL_CAPSULE | ORAL | 3 refills | Status: AC

## 2023-07-15 NOTE — Progress Notes (Signed)
 Results sent through MyChart.  Schedule a 68-month med check visit

## 2023-07-21 ENCOUNTER — Ambulatory Visit: Payer: PRIVATE HEALTH INSURANCE | Admitting: Skilled Nursing Facility1

## 2023-10-14 ENCOUNTER — Telehealth: Payer: Self-pay | Admitting: Medical

## 2023-10-14 NOTE — Telephone Encounter (Unsigned)
 Copied from CRM [id number]. Topic: Clinical - Medication Question >> Oct 14, 2023  4:21 PM Edsel HERO wrote: Patient would like to know if provider can change her Vitamin D  prescription to Vitamin D3 + K2 so that she does not have to take multiple doses.  Please send to [facility] [phone] - , [place] - [address] AT [facility] [place] [address] & [facility] [address]  Phone: [phone] Fax: [phone]

## 2023-10-15 ENCOUNTER — Other Ambulatory Visit: Payer: Self-pay | Admitting: Medical

## 2023-10-15 MED ORDER — VITAMIN K2-VITAMIN D3 90-125 MCG PO CAPS: 1.0000 | ORAL_CAPSULE | Freq: Every day | ORAL | 1 refills | Status: AC

## 2023-10-15 NOTE — Telephone Encounter (Signed)
 Patient is wanting a rx for D3 and not D2 like 50,000 is. I advised her that I wasn't sure if vitamin D3 comes in 50,000 units like D2 does. Patient wants to know if you can find out for her. I am not sure the difference in D3 and D2

## 2023-10-15 NOTE — Telephone Encounter (Signed)
 Pt was notified of results
